# Patient Record
Sex: Male | Born: 1935 | Race: White | Hispanic: No | Marital: Married | Smoking: Former smoker
Health system: Southern US, Community
[De-identification: ages and names within clinical notes are randomized; demographics above are authoritative.]

## PROBLEM LIST (undated history)

## (undated) DIAGNOSIS — E119 Type 2 diabetes mellitus without complications: Secondary | ICD-10-CM

## (undated) DIAGNOSIS — K219 Gastro-esophageal reflux disease without esophagitis: Secondary | ICD-10-CM

## (undated) DIAGNOSIS — E781 Pure hyperglyceridemia: Secondary | ICD-10-CM

## (undated) DIAGNOSIS — M159 Polyosteoarthritis, unspecified: Secondary | ICD-10-CM

## (undated) DIAGNOSIS — K589 Irritable bowel syndrome without diarrhea: Secondary | ICD-10-CM

## (undated) HISTORY — DX: Irritable bowel syndrome, unspecified: K58.9

## (undated) HISTORY — DX: Polyosteoarthritis, unspecified: M15.9

## (undated) HISTORY — PX: OTHER SURGICAL HISTORY: SHX169

## (undated) HISTORY — DX: Gastro-esophageal reflux disease without esophagitis: K21.9

## (undated) HISTORY — DX: Pure hyperglyceridemia: E78.1

## (undated) HISTORY — DX: Type 2 diabetes mellitus without complications: E11.9

---

## 2004-05-04 ENCOUNTER — Ambulatory Visit (HOSPITAL_COMMUNITY): Admission: RE | Admit: 2004-05-04 | Discharge: 2004-05-04 | Payer: Self-pay | Admitting: Family Medicine

## 2004-09-01 ENCOUNTER — Ambulatory Visit (HOSPITAL_COMMUNITY): Admission: RE | Admit: 2004-09-01 | Discharge: 2004-09-01 | Payer: Self-pay | Admitting: Family Medicine

## 2005-08-11 ENCOUNTER — Ambulatory Visit (HOSPITAL_COMMUNITY): Admission: RE | Admit: 2005-08-11 | Discharge: 2005-08-11 | Payer: Self-pay | Admitting: Neurosurgery

## 2005-08-29 HISTORY — PX: BACK SURGERY: SHX140

## 2005-08-31 ENCOUNTER — Inpatient Hospital Stay (HOSPITAL_COMMUNITY): Admission: RE | Admit: 2005-08-31 | Discharge: 2005-09-02 | Payer: Self-pay | Admitting: Neurosurgery

## 2009-06-19 ENCOUNTER — Ambulatory Visit (HOSPITAL_COMMUNITY): Admission: RE | Admit: 2009-06-19 | Discharge: 2009-06-19 | Payer: Self-pay | Admitting: Internal Medicine

## 2009-06-24 ENCOUNTER — Ambulatory Visit (HOSPITAL_COMMUNITY): Admission: RE | Admit: 2009-06-24 | Discharge: 2009-06-24 | Payer: Self-pay | Admitting: Internal Medicine

## 2010-01-11 ENCOUNTER — Inpatient Hospital Stay (HOSPITAL_COMMUNITY): Admission: RE | Admit: 2010-01-11 | Discharge: 2010-01-12 | Payer: Self-pay | Admitting: Neurosurgery

## 2010-11-16 LAB — BASIC METABOLIC PANEL
CO2: 26 mEq/L (ref 19–32)
Calcium: 9.1 mg/dL (ref 8.4–10.5)
Chloride: 104 mEq/L (ref 96–112)
Creatinine, Ser: 0.96 mg/dL (ref 0.4–1.5)
GFR calc Af Amer: >60 mL/min (ref >60)
Glucose, Bld: 143 mg/dL — ABNORMAL HIGH (ref 70–99)

## 2010-11-16 LAB — SURGICAL PCR SCREEN
MRSA, PCR: NEGATIVE
Staphylococcus aureus: NEGATIVE

## 2010-11-16 LAB — CBC
Hemoglobin: 15.6 g/dL (ref 13.0–17.0)
MCHC: 35.1 g/dL (ref 30.0–36.0)
MCV: 92.4 fL (ref 78.0–100.0)
RBC: 4.8 MIL/uL (ref 4.22–5.81)
RDW: 13.3 % (ref 11.5–15.5)

## 2010-11-29 ENCOUNTER — Other Ambulatory Visit (HOSPITAL_COMMUNITY): Payer: Self-pay | Admitting: Family Medicine

## 2010-11-29 DIAGNOSIS — R1010 Upper abdominal pain, unspecified: Secondary | ICD-10-CM

## 2010-11-29 LAB — CBC AND DIFFERENTIAL
HCT: 48 % (ref 41–53)
Hemoglobin: 16.3 g/dL (ref 13.5–17.5)

## 2010-11-29 LAB — HEPATIC FUNCTION PANEL
ALT: 54 U/L — AB (ref 10–40)
Alkaline Phosphatase: 59 U/L (ref 25–125)

## 2010-11-29 LAB — HEMOGLOBIN A1C: Hgb A1c MFr Bld: 6.4 % — AB (ref 4.0–6.0)

## 2010-11-30 ENCOUNTER — Ambulatory Visit (HOSPITAL_COMMUNITY)
Admission: RE | Admit: 2010-11-30 | Discharge: 2010-11-30 | Disposition: A | Discharge status: Home / Self Care | Payer: Medicare Other | Source: Ambulatory Visit | Attending: Family Medicine | Admitting: Family Medicine

## 2010-11-30 DIAGNOSIS — K7689 Other specified diseases of liver: Secondary | ICD-10-CM | POA: Insufficient documentation

## 2010-11-30 DIAGNOSIS — R109 Unspecified abdominal pain: Secondary | ICD-10-CM | POA: Insufficient documentation

## 2010-11-30 DIAGNOSIS — R1010 Upper abdominal pain, unspecified: Secondary | ICD-10-CM

## 2010-12-13 ENCOUNTER — Ambulatory Visit (INDEPENDENT_AMBULATORY_CARE_PROVIDER_SITE_OTHER): Payer: Medicare Other | Admitting: Internal Medicine

## 2010-12-13 DIAGNOSIS — R142 Eructation: Secondary | ICD-10-CM

## 2010-12-14 ENCOUNTER — Other Ambulatory Visit (INDEPENDENT_AMBULATORY_CARE_PROVIDER_SITE_OTHER): Payer: Self-pay | Admitting: Internal Medicine

## 2010-12-14 DIAGNOSIS — R110 Nausea: Secondary | ICD-10-CM

## 2010-12-14 DIAGNOSIS — R140 Abdominal distension (gaseous): Secondary | ICD-10-CM

## 2010-12-14 DIAGNOSIS — R1013 Epigastric pain: Secondary | ICD-10-CM

## 2010-12-16 ENCOUNTER — Encounter (HOSPITAL_COMMUNITY): Payer: Self-pay

## 2010-12-16 ENCOUNTER — Encounter (HOSPITAL_COMMUNITY)
Admission: RE | Admit: 2010-12-16 | Discharge: 2010-12-16 | Disposition: A | Discharge status: Home / Self Care | Payer: Medicare Other | Source: Ambulatory Visit | Attending: Internal Medicine | Admitting: Internal Medicine

## 2010-12-16 DIAGNOSIS — R140 Abdominal distension (gaseous): Secondary | ICD-10-CM

## 2010-12-16 DIAGNOSIS — R110 Nausea: Secondary | ICD-10-CM | POA: Insufficient documentation

## 2010-12-16 DIAGNOSIS — R1013 Epigastric pain: Secondary | ICD-10-CM | POA: Insufficient documentation

## 2010-12-16 MED ORDER — TECHNETIUM TC 99M MEBROFENIN IV KIT
5.0000 | PACK | Freq: Once | INTRAVENOUS | Status: AC | PRN
  Administered 2010-12-16: 5.37 via INTRAVENOUS

## 2011-01-14 NOTE — Op Note (Signed)
Cody Johns, Cody Johns             ACCOUNT NO.:  [id number]   MEDICAL RECORD NO.:  [id number]          PATIENT TYPE:  INP   LOCATION:  5022                         FACILITY:  [facility]   PHYSICIAN:  Cristi Loron, M.D.DATE OF BIRTH:  [birth year]   DATE OF PROCEDURE:  08/31/2005  DATE OF DISCHARGE:  08/11/2005                                 OPERATIVE REPORT   BRIEF HISTORY:  The patient is a 75 year old white male who has suffered  from severe back and bilateral leg pain.  He has failed medical management  and was worked up with a lumbar MRI which demonstrated lumbar stenosis.  His  symptoms were consistent with neurogenic claudication.  I discussed the  various treatment options with the patient including doing nothing,  continuing medical management  and surgery, etc.  The patient has weighed  the risks, benefits and alternatives to surgery and decided to proceed with  a decompressive lumbar laminectomy.   PREOPERATIVE DIAGNOSIS:  L2-3, L3-4, L4-5 spinal stenosis, lumbar  radiculopathy and lumbago, disk degeneration.   POSTOPERATIVE DIAGNOSIS:  L2-3, L3-4, L4-5 spinal stenosis, lumbar  radiculopathy and lumbago, disk degeneration.   OPERATION PERFORMED:  L3 and L4 decompressive laminectomy, bilateral  foraminotomy and bilateral L2 laminotomy, foraminotomy.   SURGEON:  Cristi Loron, M.D.   ASSISTANT:  Clydene Fake, M.D.   ANESTHESIA:  General endotracheal.   ESTIMATED BLOOD LOSS:  150 mL.   SPECIMENS:  None.   DRAINS:  None.   COMPLICATIONS:  None.   DESCRIPTION OF PROCEDURE:  The patient was brought to the operating room by  the anesthesia team.  General endotracheal anesthesia was induced.  The  patient was then carefully turned to the prone position on the Wilson frame.  His lumbosacral region was then shaved and prepared with Betadine scrub and  Betadine solution and sterile drapes were applied.  I then injected the area  to be incised with Marcaine with  epinephrine solution.  I used a scalpel to  make a midline incision over the L2-3, 3-4 and 4-5 interspaces.  I used  electrocautery to perform subperiosteal dissection exposing the bilateral  spinous processes of the lamina of L2, 3, 4, and 5.  I obtained  intraoperative radiograph to confirm our location.  I then inserted the  [facility] retractor for exposure.  I then incised the interspinous ligament  at L2-3, 3-4 and 4-5 with the scalpel.  I used the Leksell rongeur to remove  the spinous process of L3 and L4.  I then used a high speed drill to perform  bilateral L4, L3 and L2 laminotomies.  I completed the laminectomy at L3 and  L4 using the Kerrison punch and widened the L2 laminotomies.  I removed the  ligamentum flavum of L2-3, 3-4 and 4-5 and then performed a foraminotomy  about the bilateral L3, 4, and 5 nerve roots.  There was considerable  lateral recess stenosis, worse at L3-4.  We removed the excess ligamentum  flavum from the lateral  recess and as above, performed generous  foraminotomies about the bilateral L3, 4, and 5 nerve roots completing  the  decompression.  I then palpated along the ventral surface of the thecal sac  and along the exit route of the nerve roots and noted that they were well  decompressed.  I used a nerve hook to palpate the intervertebral disk at L2-  3, 3-4 and 4-5 and there was some bulging but no herniations were noted.  I  then obtained hemostasis using bipolar electrocautery and irrigated the  wound out with bacitracin solution.  I then removed the McCullough retractor  and then reapproximated the patient's thoracolumbar fascia with interrupted  #1 Vicryl sutures, subcutaneous tissue with interrupted 2-0 Vicryl suture  and the skin with Steri-Strips and benzoin. The wound was then coated with  bacitracin ointment, sterile dressing was applied, the drapes were removed.  The patient was subsequently returned to supine position where he was   extubated by the anesthesia team and transported to the post anesthesia care  unit in stable condition.  All sponge, needle and instrument counts were  correct at the end of the case.      Cristi Loron, M.D.  Electronically Signed     JDJ/MEDQ  D:  08/31/2005  T:  09/01/2005  Job:  [id number]

## 2011-01-17 LAB — HEPATIC FUNCTION PANEL
ALT: 29 U/L (ref 10–40)
AST: 28 U/L (ref 14–40)
Bilirubin, Total: 1 mg/dL

## 2011-03-08 ENCOUNTER — Encounter (INDEPENDENT_AMBULATORY_CARE_PROVIDER_SITE_OTHER): Payer: Self-pay

## 2011-03-08 DIAGNOSIS — M159 Polyosteoarthritis, unspecified: Secondary | ICD-10-CM | POA: Insufficient documentation

## 2011-03-08 DIAGNOSIS — E781 Pure hyperglyceridemia: Secondary | ICD-10-CM | POA: Insufficient documentation

## 2011-03-08 DIAGNOSIS — K589 Irritable bowel syndrome without diarrhea: Secondary | ICD-10-CM | POA: Insufficient documentation

## 2011-03-21 ENCOUNTER — Ambulatory Visit (INDEPENDENT_AMBULATORY_CARE_PROVIDER_SITE_OTHER): Payer: Medicare Other | Admitting: Internal Medicine

## 2011-05-30 ENCOUNTER — Ambulatory Visit (INDEPENDENT_AMBULATORY_CARE_PROVIDER_SITE_OTHER): Payer: Medicare Other | Admitting: Internal Medicine

## 2012-05-01 ENCOUNTER — Other Ambulatory Visit (HOSPITAL_COMMUNITY): Payer: Self-pay | Admitting: Internal Medicine

## 2012-05-01 DIAGNOSIS — Z0000 Encounter for general adult medical examination without abnormal findings: Secondary | ICD-10-CM

## 2012-05-03 ENCOUNTER — Ambulatory Visit (HOSPITAL_COMMUNITY)
Admission: RE | Admit: 2012-05-03 | Discharge: 2012-05-03 | Disposition: A | Discharge status: Home / Self Care | Payer: Medicare Other | Source: Ambulatory Visit | Attending: Internal Medicine | Admitting: Internal Medicine

## 2012-05-03 DIAGNOSIS — I6529 Occlusion and stenosis of unspecified carotid artery: Secondary | ICD-10-CM | POA: Insufficient documentation

## 2012-05-03 DIAGNOSIS — Z0000 Encounter for general adult medical examination without abnormal findings: Secondary | ICD-10-CM

## 2012-05-03 DIAGNOSIS — R0989 Other specified symptoms and signs involving the circulatory and respiratory systems: Secondary | ICD-10-CM | POA: Insufficient documentation

## 2012-05-03 DIAGNOSIS — F17200 Nicotine dependence, unspecified, uncomplicated: Secondary | ICD-10-CM | POA: Insufficient documentation

## 2013-07-30 ENCOUNTER — Other Ambulatory Visit (HOSPITAL_COMMUNITY): Payer: Self-pay | Admitting: Internal Medicine

## 2013-08-05 ENCOUNTER — Ambulatory Visit (HOSPITAL_COMMUNITY)
Admission: RE | Admit: 2013-08-05 | Discharge: 2013-08-05 | Disposition: A | Discharge status: Home / Self Care | Payer: Medicare Other | Source: Ambulatory Visit | Attending: Internal Medicine | Admitting: Internal Medicine

## 2013-08-05 DIAGNOSIS — I6529 Occlusion and stenosis of unspecified carotid artery: Secondary | ICD-10-CM | POA: Insufficient documentation

## 2015-11-18 DIAGNOSIS — K4090 Unilateral inguinal hernia, without obstruction or gangrene, not specified as recurrent: Secondary | ICD-10-CM | POA: Diagnosis not present

## 2015-11-18 DIAGNOSIS — Z0000 Encounter for general adult medical examination without abnormal findings: Secondary | ICD-10-CM | POA: Diagnosis not present

## 2015-11-18 DIAGNOSIS — R972 Elevated prostate specific antigen [PSA]: Secondary | ICD-10-CM | POA: Diagnosis not present

## 2015-11-26 DIAGNOSIS — E782 Mixed hyperlipidemia: Secondary | ICD-10-CM | POA: Diagnosis not present

## 2015-11-26 DIAGNOSIS — Z1389 Encounter for screening for other disorder: Secondary | ICD-10-CM | POA: Diagnosis not present

## 2015-11-26 DIAGNOSIS — Z0000 Encounter for general adult medical examination without abnormal findings: Secondary | ICD-10-CM | POA: Diagnosis not present

## 2015-11-26 DIAGNOSIS — Z6828 Body mass index (BMI) 28.0-28.9, adult: Secondary | ICD-10-CM | POA: Diagnosis not present

## 2015-11-26 DIAGNOSIS — R972 Elevated prostate specific antigen [PSA]: Secondary | ICD-10-CM | POA: Diagnosis not present

## 2015-11-26 DIAGNOSIS — R7309 Other abnormal glucose: Secondary | ICD-10-CM | POA: Diagnosis not present

## 2015-12-18 DIAGNOSIS — M25512 Pain in left shoulder: Secondary | ICD-10-CM | POA: Diagnosis not present

## 2015-12-18 DIAGNOSIS — M7542 Impingement syndrome of left shoulder: Secondary | ICD-10-CM | POA: Diagnosis not present

## 2016-02-18 DIAGNOSIS — Z6827 Body mass index (BMI) 27.0-27.9, adult: Secondary | ICD-10-CM | POA: Diagnosis not present

## 2016-02-18 DIAGNOSIS — E663 Overweight: Secondary | ICD-10-CM | POA: Diagnosis not present

## 2016-02-18 DIAGNOSIS — E119 Type 2 diabetes mellitus without complications: Secondary | ICD-10-CM | POA: Diagnosis not present

## 2016-02-18 DIAGNOSIS — Z1389 Encounter for screening for other disorder: Secondary | ICD-10-CM | POA: Diagnosis not present

## 2016-03-28 DIAGNOSIS — D485 Neoplasm of uncertain behavior of skin: Secondary | ICD-10-CM | POA: Diagnosis not present

## 2016-03-28 DIAGNOSIS — L98499 Non-pressure chronic ulcer of skin of other sites with unspecified severity: Secondary | ICD-10-CM | POA: Diagnosis not present

## 2016-03-28 DIAGNOSIS — L570 Actinic keratosis: Secondary | ICD-10-CM | POA: Diagnosis not present

## 2016-06-01 DIAGNOSIS — H2513 Age-related nuclear cataract, bilateral: Secondary | ICD-10-CM | POA: Diagnosis not present

## 2016-06-01 DIAGNOSIS — H02834 Dermatochalasis of left upper eyelid: Secondary | ICD-10-CM | POA: Diagnosis not present

## 2016-06-01 DIAGNOSIS — H02831 Dermatochalasis of right upper eyelid: Secondary | ICD-10-CM | POA: Diagnosis not present

## 2016-10-03 DIAGNOSIS — L98499 Non-pressure chronic ulcer of skin of other sites with unspecified severity: Secondary | ICD-10-CM | POA: Diagnosis not present

## 2016-10-03 DIAGNOSIS — D492 Neoplasm of unspecified behavior of bone, soft tissue, and skin: Secondary | ICD-10-CM | POA: Diagnosis not present

## 2016-10-03 DIAGNOSIS — L570 Actinic keratosis: Secondary | ICD-10-CM | POA: Diagnosis not present

## 2017-01-26 DIAGNOSIS — Z1389 Encounter for screening for other disorder: Secondary | ICD-10-CM | POA: Diagnosis not present

## 2017-01-26 DIAGNOSIS — Z6827 Body mass index (BMI) 27.0-27.9, adult: Secondary | ICD-10-CM | POA: Diagnosis not present

## 2017-01-26 DIAGNOSIS — E782 Mixed hyperlipidemia: Secondary | ICD-10-CM | POA: Diagnosis not present

## 2017-01-26 DIAGNOSIS — M1991 Primary osteoarthritis, unspecified site: Secondary | ICD-10-CM | POA: Diagnosis not present

## 2017-01-26 DIAGNOSIS — E119 Type 2 diabetes mellitus without complications: Secondary | ICD-10-CM | POA: Diagnosis not present

## 2017-01-26 DIAGNOSIS — M2041 Other hammer toe(s) (acquired), right foot: Secondary | ICD-10-CM | POA: Diagnosis not present

## 2017-02-27 ENCOUNTER — Ambulatory Visit (INDEPENDENT_AMBULATORY_CARE_PROVIDER_SITE_OTHER): Payer: PPO | Admitting: Otolaryngology

## 2017-02-27 DIAGNOSIS — H9012 Conductive hearing loss, unilateral, left ear, with unrestricted hearing on the contralateral side: Secondary | ICD-10-CM

## 2017-02-27 DIAGNOSIS — H6122 Impacted cerumen, left ear: Secondary | ICD-10-CM | POA: Diagnosis not present

## 2017-03-13 ENCOUNTER — Ambulatory Visit (INDEPENDENT_AMBULATORY_CARE_PROVIDER_SITE_OTHER): Payer: PPO | Admitting: Otolaryngology

## 2017-03-13 DIAGNOSIS — H6122 Impacted cerumen, left ear: Secondary | ICD-10-CM | POA: Diagnosis not present

## 2017-06-16 DIAGNOSIS — R972 Elevated prostate specific antigen [PSA]: Secondary | ICD-10-CM | POA: Diagnosis not present

## 2017-06-16 DIAGNOSIS — M1991 Primary osteoarthritis, unspecified site: Secondary | ICD-10-CM | POA: Diagnosis not present

## 2017-06-16 DIAGNOSIS — K589 Irritable bowel syndrome without diarrhea: Secondary | ICD-10-CM | POA: Diagnosis not present

## 2017-06-16 DIAGNOSIS — E663 Overweight: Secondary | ICD-10-CM | POA: Diagnosis not present

## 2017-06-16 DIAGNOSIS — Z6826 Body mass index (BMI) 26.0-26.9, adult: Secondary | ICD-10-CM | POA: Diagnosis not present

## 2017-06-16 DIAGNOSIS — E782 Mixed hyperlipidemia: Secondary | ICD-10-CM | POA: Diagnosis not present

## 2017-06-16 DIAGNOSIS — E119 Type 2 diabetes mellitus without complications: Secondary | ICD-10-CM | POA: Diagnosis not present

## 2017-07-14 DIAGNOSIS — R972 Elevated prostate specific antigen [PSA]: Secondary | ICD-10-CM | POA: Diagnosis not present

## 2017-07-14 DIAGNOSIS — N401 Enlarged prostate with lower urinary tract symptoms: Secondary | ICD-10-CM | POA: Diagnosis not present

## 2017-07-14 DIAGNOSIS — R3915 Urgency of urination: Secondary | ICD-10-CM | POA: Diagnosis not present

## 2017-08-02 DIAGNOSIS — H2513 Age-related nuclear cataract, bilateral: Secondary | ICD-10-CM | POA: Diagnosis not present

## 2017-08-02 DIAGNOSIS — H02831 Dermatochalasis of right upper eyelid: Secondary | ICD-10-CM | POA: Diagnosis not present

## 2017-08-02 DIAGNOSIS — H02834 Dermatochalasis of left upper eyelid: Secondary | ICD-10-CM | POA: Diagnosis not present

## 2017-08-02 DIAGNOSIS — H04123 Dry eye syndrome of bilateral lacrimal glands: Secondary | ICD-10-CM | POA: Diagnosis not present

## 2017-10-05 DIAGNOSIS — K589 Irritable bowel syndrome without diarrhea: Secondary | ICD-10-CM | POA: Diagnosis not present

## 2017-10-05 DIAGNOSIS — D696 Thrombocytopenia, unspecified: Secondary | ICD-10-CM | POA: Diagnosis not present

## 2017-10-05 DIAGNOSIS — Z0001 Encounter for general adult medical examination with abnormal findings: Secondary | ICD-10-CM | POA: Diagnosis not present

## 2017-10-05 DIAGNOSIS — E782 Mixed hyperlipidemia: Secondary | ICD-10-CM | POA: Diagnosis not present

## 2017-10-05 DIAGNOSIS — Z1389 Encounter for screening for other disorder: Secondary | ICD-10-CM | POA: Diagnosis not present

## 2017-10-05 DIAGNOSIS — E663 Overweight: Secondary | ICD-10-CM | POA: Diagnosis not present

## 2017-10-05 DIAGNOSIS — Z6827 Body mass index (BMI) 27.0-27.9, adult: Secondary | ICD-10-CM | POA: Diagnosis not present

## 2017-10-05 DIAGNOSIS — E119 Type 2 diabetes mellitus without complications: Secondary | ICD-10-CM | POA: Diagnosis not present

## 2017-10-05 DIAGNOSIS — M1991 Primary osteoarthritis, unspecified site: Secondary | ICD-10-CM | POA: Diagnosis not present

## 2017-10-05 DIAGNOSIS — R972 Elevated prostate specific antigen [PSA]: Secondary | ICD-10-CM | POA: Diagnosis not present

## 2018-02-08 DIAGNOSIS — E663 Overweight: Secondary | ICD-10-CM | POA: Diagnosis not present

## 2018-02-08 DIAGNOSIS — E1165 Type 2 diabetes mellitus with hyperglycemia: Secondary | ICD-10-CM | POA: Diagnosis not present

## 2018-02-08 DIAGNOSIS — E782 Mixed hyperlipidemia: Secondary | ICD-10-CM | POA: Diagnosis not present

## 2018-02-08 DIAGNOSIS — G43909 Migraine, unspecified, not intractable, without status migrainosus: Secondary | ICD-10-CM | POA: Diagnosis not present

## 2018-02-08 DIAGNOSIS — T464X5A Adverse effect of angiotensin-converting-enzyme inhibitors, initial encounter: Secondary | ICD-10-CM | POA: Diagnosis not present

## 2018-02-08 DIAGNOSIS — H532 Diplopia: Secondary | ICD-10-CM | POA: Diagnosis not present

## 2018-02-08 DIAGNOSIS — Z6827 Body mass index (BMI) 27.0-27.9, adult: Secondary | ICD-10-CM | POA: Diagnosis not present

## 2018-02-12 ENCOUNTER — Other Ambulatory Visit (HOSPITAL_COMMUNITY): Payer: Self-pay | Admitting: Family Medicine

## 2018-02-12 DIAGNOSIS — I708 Atherosclerosis of other arteries: Secondary | ICD-10-CM

## 2018-02-12 DIAGNOSIS — I7090 Unspecified atherosclerosis: Secondary | ICD-10-CM

## 2018-02-16 ENCOUNTER — Ambulatory Visit (HOSPITAL_COMMUNITY)
Admission: RE | Admit: 2018-02-16 | Discharge: 2018-02-16 | Disposition: A | Discharge status: Home / Self Care | Payer: PPO | Source: Ambulatory Visit | Attending: Family Medicine | Admitting: Family Medicine

## 2018-02-16 DIAGNOSIS — I6523 Occlusion and stenosis of bilateral carotid arteries: Secondary | ICD-10-CM | POA: Diagnosis not present

## 2018-02-16 DIAGNOSIS — I7090 Unspecified atherosclerosis: Secondary | ICD-10-CM

## 2018-02-16 DIAGNOSIS — I708 Atherosclerosis of other arteries: Secondary | ICD-10-CM | POA: Diagnosis not present

## 2018-02-16 DIAGNOSIS — I6529 Occlusion and stenosis of unspecified carotid artery: Secondary | ICD-10-CM | POA: Diagnosis not present

## 2018-03-14 DIAGNOSIS — H903 Sensorineural hearing loss, bilateral: Secondary | ICD-10-CM | POA: Diagnosis not present

## 2018-03-29 DIAGNOSIS — K4090 Unilateral inguinal hernia, without obstruction or gangrene, not specified as recurrent: Secondary | ICD-10-CM | POA: Diagnosis not present

## 2018-03-29 DIAGNOSIS — R7309 Other abnormal glucose: Secondary | ICD-10-CM | POA: Diagnosis not present

## 2018-03-29 DIAGNOSIS — E663 Overweight: Secondary | ICD-10-CM | POA: Diagnosis not present

## 2018-03-29 DIAGNOSIS — E119 Type 2 diabetes mellitus without complications: Secondary | ICD-10-CM | POA: Diagnosis not present

## 2018-03-29 DIAGNOSIS — Z6827 Body mass index (BMI) 27.0-27.9, adult: Secondary | ICD-10-CM | POA: Diagnosis not present

## 2018-03-29 DIAGNOSIS — S39012A Strain of muscle, fascia and tendon of lower back, initial encounter: Secondary | ICD-10-CM | POA: Diagnosis not present

## 2018-04-26 DIAGNOSIS — E663 Overweight: Secondary | ICD-10-CM | POA: Diagnosis not present

## 2018-04-26 DIAGNOSIS — E1165 Type 2 diabetes mellitus with hyperglycemia: Secondary | ICD-10-CM | POA: Diagnosis not present

## 2018-04-26 DIAGNOSIS — Z6827 Body mass index (BMI) 27.0-27.9, adult: Secondary | ICD-10-CM | POA: Diagnosis not present

## 2018-05-26 DIAGNOSIS — S72141A Displaced intertrochanteric fracture of right femur, initial encounter for closed fracture: Secondary | ICD-10-CM | POA: Diagnosis not present

## 2018-05-28 DIAGNOSIS — S72141A Displaced intertrochanteric fracture of right femur, initial encounter for closed fracture: Secondary | ICD-10-CM | POA: Diagnosis not present

## 2018-05-28 DIAGNOSIS — I10 Essential (primary) hypertension: Secondary | ICD-10-CM | POA: Diagnosis not present

## 2018-05-28 DIAGNOSIS — M6281 Muscle weakness (generalized): Secondary | ICD-10-CM | POA: Diagnosis not present

## 2018-05-28 DIAGNOSIS — Z9181 History of falling: Secondary | ICD-10-CM | POA: Diagnosis not present

## 2018-05-28 DIAGNOSIS — Z7984 Long term (current) use of oral hypoglycemic drugs: Secondary | ICD-10-CM | POA: Diagnosis not present

## 2018-05-28 DIAGNOSIS — Z96641 Presence of right artificial hip joint: Secondary | ICD-10-CM | POA: Diagnosis not present

## 2018-05-28 DIAGNOSIS — R110 Nausea: Secondary | ICD-10-CM | POA: Diagnosis not present

## 2018-05-28 DIAGNOSIS — S72001A Fracture of unspecified part of neck of right femur, initial encounter for closed fracture: Secondary | ICD-10-CM | POA: Diagnosis not present

## 2018-05-28 DIAGNOSIS — M8588 Other specified disorders of bone density and structure, other site: Secondary | ICD-10-CM | POA: Diagnosis not present

## 2018-05-28 DIAGNOSIS — E119 Type 2 diabetes mellitus without complications: Secondary | ICD-10-CM | POA: Diagnosis not present

## 2018-05-28 DIAGNOSIS — M84359A Stress fracture, hip, unspecified, initial encounter for fracture: Secondary | ICD-10-CM | POA: Diagnosis not present

## 2018-05-28 DIAGNOSIS — M25551 Pain in right hip: Secondary | ICD-10-CM | POA: Diagnosis not present

## 2018-05-28 DIAGNOSIS — S72001D Fracture of unspecified part of neck of right femur, subsequent encounter for closed fracture with routine healing: Secondary | ICD-10-CM | POA: Diagnosis not present

## 2018-05-28 DIAGNOSIS — S72041A Displaced fracture of base of neck of right femur, initial encounter for closed fracture: Secondary | ICD-10-CM | POA: Diagnosis not present

## 2018-05-28 DIAGNOSIS — R52 Pain, unspecified: Secondary | ICD-10-CM | POA: Diagnosis not present

## 2018-05-28 DIAGNOSIS — S79911A Unspecified injury of right hip, initial encounter: Secondary | ICD-10-CM | POA: Diagnosis not present

## 2018-05-28 DIAGNOSIS — E7849 Other hyperlipidemia: Secondary | ICD-10-CM | POA: Diagnosis not present

## 2018-05-28 DIAGNOSIS — Z471 Aftercare following joint replacement surgery: Secondary | ICD-10-CM | POA: Diagnosis not present

## 2018-05-28 DIAGNOSIS — R2689 Other abnormalities of gait and mobility: Secondary | ICD-10-CM | POA: Diagnosis not present

## 2018-05-28 DIAGNOSIS — W19XXXA Unspecified fall, initial encounter: Secondary | ICD-10-CM | POA: Diagnosis not present

## 2018-05-28 DIAGNOSIS — W010XXA Fall on same level from slipping, tripping and stumbling without subsequent striking against object, initial encounter: Secondary | ICD-10-CM | POA: Diagnosis not present

## 2018-05-28 DIAGNOSIS — Z4889 Encounter for other specified surgical aftercare: Secondary | ICD-10-CM | POA: Diagnosis not present

## 2018-05-28 DIAGNOSIS — R0902 Hypoxemia: Secondary | ICD-10-CM | POA: Diagnosis not present

## 2018-05-28 DIAGNOSIS — R609 Edema, unspecified: Secondary | ICD-10-CM | POA: Diagnosis not present

## 2018-05-31 DIAGNOSIS — S72041A Displaced fracture of base of neck of right femur, initial encounter for closed fracture: Secondary | ICD-10-CM | POA: Diagnosis not present

## 2018-06-04 DIAGNOSIS — S72141A Displaced intertrochanteric fracture of right femur, initial encounter for closed fracture: Secondary | ICD-10-CM | POA: Diagnosis not present

## 2018-06-04 DIAGNOSIS — I10 Essential (primary) hypertension: Secondary | ICD-10-CM | POA: Diagnosis not present

## 2018-06-04 DIAGNOSIS — M84359A Stress fracture, hip, unspecified, initial encounter for fracture: Secondary | ICD-10-CM | POA: Diagnosis not present

## 2018-06-04 DIAGNOSIS — E119 Type 2 diabetes mellitus without complications: Secondary | ICD-10-CM | POA: Diagnosis not present

## 2018-06-04 DIAGNOSIS — W19XXXA Unspecified fall, initial encounter: Secondary | ICD-10-CM | POA: Diagnosis not present

## 2018-06-04 DIAGNOSIS — Z9181 History of falling: Secondary | ICD-10-CM | POA: Diagnosis not present

## 2018-06-04 DIAGNOSIS — R2689 Other abnormalities of gait and mobility: Secondary | ICD-10-CM | POA: Diagnosis not present

## 2018-06-04 DIAGNOSIS — M1611 Unilateral primary osteoarthritis, right hip: Secondary | ICD-10-CM | POA: Diagnosis not present

## 2018-06-04 DIAGNOSIS — E7849 Other hyperlipidemia: Secondary | ICD-10-CM | POA: Diagnosis not present

## 2018-06-04 DIAGNOSIS — Z7984 Long term (current) use of oral hypoglycemic drugs: Secondary | ICD-10-CM | POA: Diagnosis not present

## 2018-06-04 DIAGNOSIS — S72001D Fracture of unspecified part of neck of right femur, subsequent encounter for closed fracture with routine healing: Secondary | ICD-10-CM | POA: Diagnosis not present

## 2018-06-04 DIAGNOSIS — Z96641 Presence of right artificial hip joint: Secondary | ICD-10-CM | POA: Diagnosis not present

## 2018-06-04 DIAGNOSIS — M6281 Muscle weakness (generalized): Secondary | ICD-10-CM | POA: Diagnosis not present

## 2018-06-06 DIAGNOSIS — M1611 Unilateral primary osteoarthritis, right hip: Secondary | ICD-10-CM | POA: Diagnosis not present

## 2018-06-09 DIAGNOSIS — S72141D Displaced intertrochanteric fracture of right femur, subsequent encounter for closed fracture with routine healing: Secondary | ICD-10-CM | POA: Diagnosis not present

## 2018-06-09 DIAGNOSIS — M1990 Unspecified osteoarthritis, unspecified site: Secondary | ICD-10-CM | POA: Diagnosis not present

## 2018-06-09 DIAGNOSIS — E119 Type 2 diabetes mellitus without complications: Secondary | ICD-10-CM | POA: Diagnosis not present

## 2018-06-09 DIAGNOSIS — Z9181 History of falling: Secondary | ICD-10-CM | POA: Diagnosis not present

## 2018-06-09 DIAGNOSIS — E785 Hyperlipidemia, unspecified: Secondary | ICD-10-CM | POA: Diagnosis not present

## 2018-06-09 DIAGNOSIS — Z7982 Long term (current) use of aspirin: Secondary | ICD-10-CM | POA: Diagnosis not present

## 2018-06-09 DIAGNOSIS — I10 Essential (primary) hypertension: Secondary | ICD-10-CM | POA: Diagnosis not present

## 2018-06-09 DIAGNOSIS — Z7984 Long term (current) use of oral hypoglycemic drugs: Secondary | ICD-10-CM | POA: Diagnosis not present

## 2018-06-11 ENCOUNTER — Other Ambulatory Visit: Payer: Self-pay

## 2018-06-11 NOTE — Patient Outreach (Signed)
[facility]) Care Management  06/11/2018  Cody Johns July 31, 1936 [id number]     EMMI-General Discharge RED ON EMMI ALERT Day # 1 Date: 06/09/18 Red Alert Reason: " Got discharge papers? I don't know  Know who to call about changes in condition? No"   Outreach attempt # 1 to patient. Spoke with patient who voices he is doing fairly well since return home. He denies any acute needs or concerns at this time. Reviewed and addressed red alert with patient. Patient unsure of responses that were recorded. He confirmed that he has discharge paperwork and understands them. He is aware of when, why and how to contact MD for any issues or concerns. Patient confirmed that he has MD follow up appt on this Thursday. He denies any issues with transportation. Patient able to confirm that he has all his meds and no issues or concerns regarding them. He voices that he is getting [facility] services and they have been out already to see and evaluate him. He denies any further RN CM needs or concerns at this time. Advised patient that they would get one more automated EMMI-GENERAL post discharge calls to assess how they are doing following recent hospitalization and will receive a call from a nurse if any of their responses were abnormal. Patient voiced understanding and was appreciative of f/u call.        Plan: RN CM will close case at this time.    Enzo Montgomery, RN,BSN,CCM [facility] Telephonic Care Management Coordinator Direct Phone: [phone] Toll Free: [phone] Fax: [phone]

## 2018-06-18 DIAGNOSIS — N400 Enlarged prostate without lower urinary tract symptoms: Secondary | ICD-10-CM | POA: Diagnosis not present

## 2018-06-18 DIAGNOSIS — Z6825 Body mass index (BMI) 25.0-25.9, adult: Secondary | ICD-10-CM | POA: Diagnosis not present

## 2018-06-18 DIAGNOSIS — I872 Venous insufficiency (chronic) (peripheral): Secondary | ICD-10-CM | POA: Diagnosis not present

## 2018-06-18 DIAGNOSIS — R5383 Other fatigue: Secondary | ICD-10-CM | POA: Diagnosis not present

## 2018-06-18 DIAGNOSIS — D649 Anemia, unspecified: Secondary | ICD-10-CM | POA: Diagnosis not present

## 2018-06-18 DIAGNOSIS — S72001A Fracture of unspecified part of neck of right femur, initial encounter for closed fracture: Secondary | ICD-10-CM | POA: Diagnosis not present

## 2018-06-18 DIAGNOSIS — R531 Weakness: Secondary | ICD-10-CM | POA: Diagnosis not present

## 2018-06-19 DIAGNOSIS — Z7984 Long term (current) use of oral hypoglycemic drugs: Secondary | ICD-10-CM | POA: Diagnosis not present

## 2018-06-19 DIAGNOSIS — Z9181 History of falling: Secondary | ICD-10-CM | POA: Diagnosis not present

## 2018-06-19 DIAGNOSIS — M1990 Unspecified osteoarthritis, unspecified site: Secondary | ICD-10-CM | POA: Diagnosis not present

## 2018-06-19 DIAGNOSIS — E119 Type 2 diabetes mellitus without complications: Secondary | ICD-10-CM | POA: Diagnosis not present

## 2018-06-19 DIAGNOSIS — I10 Essential (primary) hypertension: Secondary | ICD-10-CM | POA: Diagnosis not present

## 2018-06-19 DIAGNOSIS — S72141D Displaced intertrochanteric fracture of right femur, subsequent encounter for closed fracture with routine healing: Secondary | ICD-10-CM | POA: Diagnosis not present

## 2018-06-19 DIAGNOSIS — Z7982 Long term (current) use of aspirin: Secondary | ICD-10-CM | POA: Diagnosis not present

## 2018-06-19 DIAGNOSIS — E785 Hyperlipidemia, unspecified: Secondary | ICD-10-CM | POA: Diagnosis not present

## 2018-07-10 DIAGNOSIS — Z96641 Presence of right artificial hip joint: Secondary | ICD-10-CM | POA: Diagnosis not present

## 2018-07-10 DIAGNOSIS — Z79899 Other long term (current) drug therapy: Secondary | ICD-10-CM | POA: Diagnosis not present

## 2018-07-10 DIAGNOSIS — Z7984 Long term (current) use of oral hypoglycemic drugs: Secondary | ICD-10-CM | POA: Diagnosis not present

## 2018-07-10 DIAGNOSIS — R531 Weakness: Secondary | ICD-10-CM | POA: Diagnosis not present

## 2018-07-10 DIAGNOSIS — R52 Pain, unspecified: Secondary | ICD-10-CM | POA: Diagnosis not present

## 2018-07-10 DIAGNOSIS — M25551 Pain in right hip: Secondary | ICD-10-CM | POA: Diagnosis not present

## 2018-07-10 DIAGNOSIS — Z7982 Long term (current) use of aspirin: Secondary | ICD-10-CM | POA: Diagnosis not present

## 2018-07-11 DIAGNOSIS — Z96641 Presence of right artificial hip joint: Secondary | ICD-10-CM | POA: Diagnosis not present

## 2018-07-16 DIAGNOSIS — N401 Enlarged prostate with lower urinary tract symptoms: Secondary | ICD-10-CM | POA: Diagnosis not present

## 2018-07-16 DIAGNOSIS — R972 Elevated prostate specific antigen [PSA]: Secondary | ICD-10-CM | POA: Diagnosis not present

## 2018-07-16 DIAGNOSIS — R3915 Urgency of urination: Secondary | ICD-10-CM | POA: Diagnosis not present

## 2018-08-06 DIAGNOSIS — H04123 Dry eye syndrome of bilateral lacrimal glands: Secondary | ICD-10-CM | POA: Diagnosis not present

## 2018-08-06 DIAGNOSIS — H2513 Age-related nuclear cataract, bilateral: Secondary | ICD-10-CM | POA: Diagnosis not present

## 2018-08-06 DIAGNOSIS — E119 Type 2 diabetes mellitus without complications: Secondary | ICD-10-CM | POA: Diagnosis not present

## 2018-08-16 DIAGNOSIS — M1612 Unilateral primary osteoarthritis, left hip: Secondary | ICD-10-CM | POA: Diagnosis not present

## 2018-08-16 DIAGNOSIS — M1611 Unilateral primary osteoarthritis, right hip: Secondary | ICD-10-CM | POA: Diagnosis not present

## 2018-09-13 ENCOUNTER — Ambulatory Visit (HOSPITAL_COMMUNITY)
Admission: RE | Admit: 2018-09-13 | Discharge: 2018-09-13 | Disposition: A | Discharge status: Home / Self Care | Payer: PPO | Source: Ambulatory Visit | Attending: Internal Medicine | Admitting: Internal Medicine

## 2018-09-13 ENCOUNTER — Other Ambulatory Visit: Payer: Self-pay | Admitting: Internal Medicine

## 2018-09-13 DIAGNOSIS — M5431 Sciatica, right side: Secondary | ICD-10-CM | POA: Diagnosis not present

## 2018-09-13 DIAGNOSIS — I872 Venous insufficiency (chronic) (peripheral): Secondary | ICD-10-CM | POA: Diagnosis not present

## 2018-09-13 DIAGNOSIS — Z0001 Encounter for general adult medical examination with abnormal findings: Secondary | ICD-10-CM | POA: Diagnosis not present

## 2018-09-13 DIAGNOSIS — D696 Thrombocytopenia, unspecified: Secondary | ICD-10-CM | POA: Diagnosis not present

## 2018-09-13 DIAGNOSIS — Z1389 Encounter for screening for other disorder: Secondary | ICD-10-CM | POA: Diagnosis not present

## 2018-09-13 DIAGNOSIS — M79604 Pain in right leg: Secondary | ICD-10-CM | POA: Diagnosis not present

## 2018-09-13 DIAGNOSIS — E119 Type 2 diabetes mellitus without complications: Secondary | ICD-10-CM | POA: Diagnosis not present

## 2018-09-13 DIAGNOSIS — E1165 Type 2 diabetes mellitus with hyperglycemia: Secondary | ICD-10-CM | POA: Diagnosis not present

## 2018-09-13 DIAGNOSIS — G43909 Migraine, unspecified, not intractable, without status migrainosus: Secondary | ICD-10-CM | POA: Diagnosis not present

## 2018-09-13 DIAGNOSIS — I8393 Asymptomatic varicose veins of bilateral lower extremities: Secondary | ICD-10-CM | POA: Diagnosis not present

## 2018-09-13 DIAGNOSIS — I8001 Phlebitis and thrombophlebitis of superficial vessels of right lower extremity: Secondary | ICD-10-CM | POA: Diagnosis not present

## 2018-09-13 DIAGNOSIS — Z681 Body mass index (BMI) 19 or less, adult: Secondary | ICD-10-CM | POA: Diagnosis not present

## 2018-09-13 DIAGNOSIS — R600 Localized edema: Secondary | ICD-10-CM | POA: Diagnosis not present

## 2018-09-25 DIAGNOSIS — R202 Paresthesia of skin: Secondary | ICD-10-CM | POA: Diagnosis not present

## 2018-09-25 DIAGNOSIS — Z6824 Body mass index (BMI) 24.0-24.9, adult: Secondary | ICD-10-CM | POA: Diagnosis not present

## 2018-09-25 DIAGNOSIS — I872 Venous insufficiency (chronic) (peripheral): Secondary | ICD-10-CM | POA: Diagnosis not present

## 2018-09-25 DIAGNOSIS — B351 Tinea unguium: Secondary | ICD-10-CM | POA: Diagnosis not present

## 2019-01-17 DIAGNOSIS — N400 Enlarged prostate without lower urinary tract symptoms: Secondary | ICD-10-CM | POA: Diagnosis not present

## 2019-01-17 DIAGNOSIS — Z1389 Encounter for screening for other disorder: Secondary | ICD-10-CM | POA: Diagnosis not present

## 2019-01-17 DIAGNOSIS — R201 Hypoesthesia of skin: Secondary | ICD-10-CM | POA: Diagnosis not present

## 2019-01-17 DIAGNOSIS — K589 Irritable bowel syndrome without diarrhea: Secondary | ICD-10-CM | POA: Diagnosis not present

## 2019-01-17 DIAGNOSIS — R1012 Left upper quadrant pain: Secondary | ICD-10-CM | POA: Diagnosis not present

## 2019-01-17 DIAGNOSIS — K219 Gastro-esophageal reflux disease without esophagitis: Secondary | ICD-10-CM | POA: Diagnosis not present

## 2019-01-17 DIAGNOSIS — E1140 Type 2 diabetes mellitus with diabetic neuropathy, unspecified: Secondary | ICD-10-CM | POA: Diagnosis not present

## 2019-01-17 DIAGNOSIS — M1991 Primary osteoarthritis, unspecified site: Secondary | ICD-10-CM | POA: Diagnosis not present

## 2019-01-17 DIAGNOSIS — Z6824 Body mass index (BMI) 24.0-24.9, adult: Secondary | ICD-10-CM | POA: Diagnosis not present

## 2019-01-23 ENCOUNTER — Other Ambulatory Visit: Payer: Self-pay | Admitting: Internal Medicine

## 2019-01-23 ENCOUNTER — Other Ambulatory Visit (HOSPITAL_COMMUNITY): Payer: Self-pay | Admitting: Internal Medicine

## 2019-01-23 DIAGNOSIS — R109 Unspecified abdominal pain: Secondary | ICD-10-CM

## 2019-01-24 ENCOUNTER — Other Ambulatory Visit: Payer: Self-pay

## 2019-01-24 ENCOUNTER — Ambulatory Visit (INDEPENDENT_AMBULATORY_CARE_PROVIDER_SITE_OTHER): Payer: PPO | Admitting: Internal Medicine

## 2019-01-24 ENCOUNTER — Encounter (INDEPENDENT_AMBULATORY_CARE_PROVIDER_SITE_OTHER): Payer: Self-pay | Admitting: Internal Medicine

## 2019-01-24 VITALS — BP 118/66 | HR 83 | Temp 98.4°F | Ht 72.0 in | Wt 184.0 lb

## 2019-01-24 DIAGNOSIS — R1012 Left upper quadrant pain: Secondary | ICD-10-CM | POA: Diagnosis not present

## 2019-01-24 DIAGNOSIS — Z1211 Encounter for screening for malignant neoplasm of colon: Secondary | ICD-10-CM | POA: Diagnosis not present

## 2019-01-24 LAB — CBC WITH DIFFERENTIAL/PLATELET
Absolute Monocytes: 468 cells/uL (ref 200–950)
Basophils Absolute: 42 cells/uL (ref 0–200)
Basophils Relative: 0.7 %
Eosinophils Absolute: 108 cells/uL (ref 15–500)
Eosinophils Relative: 1.8 %
HCT: 42.7 % (ref 38.5–50.0)
Hemoglobin: 14.3 g/dL (ref 13.2–17.1)
Lymphs Abs: 996 cells/uL (ref 850–3900)
MCH: 29.5 pg (ref 27.0–33.0)
MCHC: 33.5 g/dL (ref 32.0–36.0)
MCV: 88.2 fL (ref 80.0–100.0)
MPV: 10.4 fL (ref 7.5–12.5)
Monocytes Relative: 7.8 %
Neutro Abs: 4386 cells/uL (ref 1500–7800)
Neutrophils Relative %: 73.1 %
Platelets: 194 10*3/uL (ref 140–400)
RBC: 4.84 10*6/uL (ref 4.20–5.80)
RDW: 13.3 % (ref 11.0–15.0)
Total Lymphocyte: 16.6 %
WBC: 6 10*3/uL (ref 3.8–10.8)

## 2019-01-24 LAB — HEPATIC FUNCTION PANEL
AG Ratio: 1.8 (calc) (ref 1.0–2.5)
ALT: 10 U/L (ref 9–46)
AST: 13 U/L (ref 10–35)
Albumin: 4.5 g/dL (ref 3.6–5.1)
Alkaline phosphatase (APISO): 60 U/L (ref 35–144)
Bilirubin, Direct: 0.2 mg/dL (ref 0.0–0.2)
Globulin: 2.5 g/dL (calc) (ref 1.9–3.7)
Indirect Bilirubin: 0.5 mg/dL (calc) (ref 0.2–1.2)
Total Bilirubin: 0.7 mg/dL (ref 0.2–1.2)
Total Protein: 7 g/dL (ref 6.1–8.1)

## 2019-01-24 NOTE — Patient Instructions (Signed)
The risks of bleeding, perforation and infection were reviewed with patient.  

## 2019-01-24 NOTE — Progress Notes (Signed)
   Subjective:    Patient ID: Cody Johns, male    DOB: [birth year], 83 y.o.   MRN: [id number]  HPI Referred by Dr. Gerarda Fraction for abdominal pain/colonoscopy. Patient is scheduled for a CT on 02/01/2019. Hx of IBS. He tells me he developed excessive gas and diarrhea. Uses generic Imodium which helps. He is still experiencing excessive. Has had symptoms for greater than 20 yrs. He also says he has noticed some LUQ pain x one month. No injury that he know of. Symptoms when he lies on his left side in bed. Has not had the pain in about a week.  CT abdomen/pelvis w CM ordered 02/01/2019 by PCP.  Weight loss due to new onset of diabetes with change in diet and hip fracture last year. Diabetes for a couple of years. Per epic last colonoscopy in 2018 with a hyperplastic polyp.  Review of Systems Past Medical History:  Diagnosis Date  . Degenerative joint disease involving multiple joints   . Diabetes ([place])   . GERD (gastroesophageal reflux disease)   . Hyperglyceridemia   . IBS (irritable bowel syndrome)       No Known Allergies  Current Outpatient Medications on File Prior to Visit  Medication Sig Dispense Refill  . loperamide (IMODIUM) 2 MG capsule Take by mouth as needed for diarrhea or loose stools.    . sitaGLIPtin-metformin (JANUMET) 50-1000 MG tablet Take 1 tablet by mouth daily.     No current facility-administered medications on file prior to visit.         Objective:   Physical Exam Blood pressure 118/66, pulse 83, temperature 98.4 F (36.9 C), height 6' (1.829 m), weight 184 lb (83.5 kg). Alert and oriented. Skin warm and dry. Oral mucosa is moist.   . Sclera anicteric, conjunctivae is pink. Thyroid not enlarged. No cervical lymphadenopathy. Lungs clear. Heart regular rate and rhythm.  Abdomen is soft. Bowel sounds are positive. No hepatomegaly. No abdominal masses felt. No tenderness.  No edema to lower extremities.         Assessment & Plan:  LUQ pain. CT abdomen/pelvis  ordered by PCP. CBC and Hepatic ordered. Screening colonoscopy.

## 2019-02-01 ENCOUNTER — Ambulatory Visit (HOSPITAL_COMMUNITY)
Admission: RE | Admit: 2019-02-01 | Discharge: 2019-02-01 | Disposition: A | Discharge status: Home / Self Care | Payer: PPO | Source: Ambulatory Visit | Attending: Internal Medicine | Admitting: Internal Medicine

## 2019-02-01 ENCOUNTER — Encounter (HOSPITAL_COMMUNITY): Payer: Self-pay

## 2019-02-01 ENCOUNTER — Other Ambulatory Visit: Payer: Self-pay

## 2019-02-01 DIAGNOSIS — R109 Unspecified abdominal pain: Secondary | ICD-10-CM | POA: Insufficient documentation

## 2019-02-01 LAB — POCT I-STAT CREATININE: Creatinine, Ser: 0.9 mg/dL (ref 0.61–1.24)

## 2019-02-01 MED ORDER — IOHEXOL 300 MG/ML  SOLN
100.0000 mL | Freq: Once | INTRAMUSCULAR | Status: AC | PRN
  Administered 2019-02-01: 09:00:00 100 mL via INTRAVENOUS

## 2019-02-05 ENCOUNTER — Other Ambulatory Visit (INDEPENDENT_AMBULATORY_CARE_PROVIDER_SITE_OTHER): Payer: Self-pay | Admitting: Internal Medicine

## 2019-02-05 DIAGNOSIS — Z1211 Encounter for screening for malignant neoplasm of colon: Secondary | ICD-10-CM | POA: Insufficient documentation

## 2019-03-27 ENCOUNTER — Telehealth (INDEPENDENT_AMBULATORY_CARE_PROVIDER_SITE_OTHER): Payer: Self-pay | Admitting: *Deleted

## 2019-03-27 ENCOUNTER — Encounter (INDEPENDENT_AMBULATORY_CARE_PROVIDER_SITE_OTHER): Payer: Self-pay | Admitting: *Deleted

## 2019-03-27 NOTE — Telephone Encounter (Signed)
Patient needs trilyte 

## 2019-03-28 MED ORDER — PEG 3350-KCL-NA BICARB-NACL 420 G PO SOLR: 4000.0000 mL | Freq: Once | ORAL | 0 refills | Status: AC

## 2019-04-29 ENCOUNTER — Other Ambulatory Visit (HOSPITAL_COMMUNITY)
Admission: RE | Admit: 2019-04-29 | Discharge: 2019-04-29 | Disposition: A | Discharge status: Home / Self Care | Payer: PPO | Source: Ambulatory Visit | Attending: Internal Medicine | Admitting: Internal Medicine

## 2019-04-29 ENCOUNTER — Other Ambulatory Visit: Payer: Self-pay

## 2019-05-01 ENCOUNTER — Encounter (HOSPITAL_COMMUNITY): Admission: RE | Payer: Self-pay | Source: Home / Self Care

## 2019-05-01 ENCOUNTER — Ambulatory Visit (HOSPITAL_COMMUNITY): Admission: RE | Admit: 2019-05-01 | Payer: PPO | Source: Home / Self Care | Admitting: Internal Medicine

## 2019-05-01 SURGERY — COLONOSCOPY
Anesthesia: Moderate Sedation

## 2019-05-03 DIAGNOSIS — R221 Localized swelling, mass and lump, neck: Secondary | ICD-10-CM | POA: Diagnosis not present

## 2019-05-03 DIAGNOSIS — Z9089 Acquired absence of other organs: Secondary | ICD-10-CM | POA: Diagnosis not present

## 2019-05-03 DIAGNOSIS — H6122 Impacted cerumen, left ear: Secondary | ICD-10-CM | POA: Diagnosis not present

## 2019-05-09 ENCOUNTER — Other Ambulatory Visit: Payer: Self-pay | Admitting: Otolaryngology

## 2019-05-09 DIAGNOSIS — R221 Localized swelling, mass and lump, neck: Secondary | ICD-10-CM

## 2019-05-16 ENCOUNTER — Other Ambulatory Visit: Payer: Self-pay

## 2019-05-16 ENCOUNTER — Ambulatory Visit
Admission: RE | Admit: 2019-05-16 | Discharge: 2019-05-16 | Disposition: A | Discharge status: Home / Self Care | Payer: PPO | Source: Ambulatory Visit | Attending: Otolaryngology | Admitting: Otolaryngology

## 2019-05-16 DIAGNOSIS — R221 Localized swelling, mass and lump, neck: Secondary | ICD-10-CM | POA: Diagnosis not present

## 2019-05-16 MED ORDER — IOPAMIDOL (ISOVUE-300) INJECTION 61%
75.0000 mL | Freq: Once | INTRAVENOUS | Status: AC | PRN
  Administered 2019-05-16: 75 mL via INTRAVENOUS

## 2019-05-17 DIAGNOSIS — S72041D Displaced fracture of base of neck of right femur, subsequent encounter for closed fracture with routine healing: Secondary | ICD-10-CM | POA: Diagnosis not present

## 2019-05-17 DIAGNOSIS — Z96641 Presence of right artificial hip joint: Secondary | ICD-10-CM | POA: Diagnosis not present

## 2019-05-22 ENCOUNTER — Ambulatory Visit: Payer: PPO | Attending: Orthopedic Surgery | Admitting: Physical Therapy

## 2019-05-22 ENCOUNTER — Encounter: Payer: Self-pay | Admitting: Physical Therapy

## 2019-05-22 ENCOUNTER — Other Ambulatory Visit: Payer: Self-pay

## 2019-05-22 DIAGNOSIS — M25551 Pain in right hip: Secondary | ICD-10-CM

## 2019-05-22 DIAGNOSIS — M6281 Muscle weakness (generalized): Secondary | ICD-10-CM | POA: Diagnosis not present

## 2019-05-22 NOTE — Therapy (Signed)
[facility] [place], [place], [address] Phone: [phone]   Fax:  [phone]  Physical Therapy Evaluation  Patient Details  Name: KAYLUB CARRAZCO MRN: [id number] Date of Birth: 1936-05-30 Referring Provider (PT): Gaynelle Arabian, MD   Encounter Date: 05/22/2019  PT End of Session - 05/22/19 1952    Visit Number  1    Number of Visits  8    Date for PT Re-Evaluation  06/26/19    Authorization Type  FOTO; progress note every 10th visit    PT Start Time  1116    PT Stop Time  1200    PT Time Calculation (min)  44 min    Activity Tolerance  Patient tolerated treatment well    Behavior During Therapy  [facility] for tasks assessed/performed       Past Medical History:  Diagnosis Date  . Degenerative joint disease involving multiple joints   . Diabetes ([place])   . GERD (gastroesophageal reflux disease)   . Hyperglyceridemia   . IBS (irritable bowel syndrome)     Past Surgical History:  Procedure Laterality Date  . BACK SURGERY  08/2005   SPINE SURGERY FOR DISC DISEASE  . Rt hip replacement     2019    There were no vitals filed for this visit.   Subjective Assessment - 05/22/19 1936    Subjective  COVID-19 screening performed upon arrival.Patient arrives to physical therapy with reports of right glute pain particularly while sitting after a right THA on 05/31/2018. Patient reports soreness turns into pain when sitting for 15-20 minutes. Patient also reports difficulties with lying on his right side while sleeping. Patient reports since surgery, his leg feels like it "locks up" and he observed a sizable bruise in his glute region. Patient reports pain at worst as a 5/10 and pain at best 2/10. Patient's goals are to decrease pain, improve movement, improve ability to perform home activities, and sit with minimal pain.    Pertinent History  Right THA 05/31/2018, DM    Limitations  Sitting;House hold activities    How long can you  sit comfortably?  15-20 mins    Diagnostic tests  x-ray: stable hip, normal results    Patient Stated Goals  decrease hip pain, drive without soreness and pain.    Currently in Pain?  Yes    Pain Score  5     Pain Location  Buttocks    Pain Orientation  Right    Pain Descriptors / Indicators  Sore;Aching    Pain Type  Surgical pain    Pain Radiating Towards  lateral R hip    Pain Onset  More than a month ago    Pain Frequency  Constant    Aggravating Factors   sitting for long periods of time    Effect of Pain on Daily Activities  sitting, driving         [facility] PT Assessment - 05/22/19 0001      Assessment   Medical Diagnosis  Presence of right artificial hip joint    Referring Provider (PT)  Gaynelle Arabian, MD    Onset Date/Surgical Date  05/31/18    Next MD Visit  06/28/2019    Prior Therapy  yes      Precautions   Precautions  Posterior Hip      Balance Screen   Has the patient fallen in the past 6 months  Yes    How many times?  1  Has the patient had a decrease in activity level because of a fear of falling?   Yes    Is the patient reluctant to leave their home because of a fear of falling?   No      Home Film/video editor residence      Prior Function   Level of Independence  Independent      Observation/Other Assessments   Focus on Therapeutic Outcomes (FOTO)   to be completed next visit      ROM / Strength   AROM / PROM / Strength  Strength      Strength   Strength Assessment Site  Hip    Right/Left Hip  Right;Left    Right Hip Flexion  4-/5    Right Hip Extension  4-/5    Right Hip ABduction  4-/5    Left Hip Flexion  4/5    Left Hip Extension  4/5    Left Hip ABduction  4/5      Palpation   Palpation comment  very tender to palpation to right medial glute, increased tension of lateral hamstring tendon, increased active trigger points along ITB      Transfers   Five time sit to stand comments   modified 11 seconds       Ambulation/Gait   Gait Pattern  Step-through pattern;Decreased stride length;Decreased stance time - right;Decreased hip/knee flexion - right                Objective measurements completed on examination: See above findings.              PT Education - 05/22/19 1949    Education Details  glute set, hip adduction, hip abduction, seated hamstring stretch    Person(s) Educated  Patient    Methods  Explanation;Demonstration;Handout    Comprehension  Verbalized understanding;Returned demonstration          PT Long Term Goals - 05/22/19 1959      PT LONG TERM GOAL #1   Title  Patient will be independent with HEP.    Time  4    Period  Weeks    Status  New      PT LONG TERM GOAL #2   Title  Patient will report ability to sit/drive for 25 minutes or greater with glute/hip pain less than or equal to 3/10    Time  4    Period  Weeks    Status  New      PT LONG TERM GOAL #3   Title  Patient will demonstrate 4+/5 or greater right hip MMT to improve stability during functional tasks.    Time  4    Period  Weeks    Status  New      PT LONG TERM GOAL #4   Title  Patient will report ability to perform ADLs and home activities with right hip/glute pain less than or equal to 2/10.    Time  4    Period  Weeks    Status  New             Plan - 05/22/19 1955    Clinical Impression Statement  Patient is an 83 year old male who presents to physical therapy with right glute pain, decreased right hip MMT, and decreased right hamstring flexibility. Patient tender to deep palpation to right glute and right ITB with notable lateral hamstring tension. Patient observed sitting with increased left weight shift.  Patient and PT discussed HEP and importance to maximize physical therapy benefit. Patient would benefit from skilled physical therapy to address deficits and address patient's goals.    Personal Factors and Comorbidities  Age;Comorbidity 1    Comorbidities  R THA  05/31/2018; DM    Examination-Activity Limitations  Sit    Examination-Participation Restrictions  Driving    Stability/Clinical Decision Making  Stable/Uncomplicated    Clinical Decision Making  Low    Rehab Potential  Good    PT Frequency  2x / week    PT Duration  4 weeks    PT Treatment/Interventions  ADLs/Self Care Home Management;Cryotherapy;Electrical Stimulation;Iontophoresis 4mg /ml Dexamethasone;Moist Heat;Gait training;Stair training;Balance training;Therapeutic exercise;Therapeutic activities;Functional mobility training;Neuromuscular re-education;Manual techniques;Patient/family education;Passive range of motion;Scar mobilization    PT Next Visit Plan  FOTO next visit; Nustep; gentle hip strengthening, hamstring stretching, STW/M to right glute, piriformis region, modalities PRN for pain relief.    PT Home Exercise Plan  see patient education section    Consulted and Agree with Plan of Care  Patient       Patient will benefit from skilled therapeutic intervention in order to improve the following deficits and impairments:  Decreased activity tolerance, Decreased strength, Decreased range of motion, Difficulty walking, Pain  Visit Diagnosis: Pain in right hip  Muscle weakness (generalized)     Problem List Patient Active Problem List   Diagnosis Date Noted  . Encounter for screening colonoscopy 02/05/2019  . Hyperglyceridemia   . IBS (irritable bowel syndrome)   . Degenerative joint disease involving multiple joints     Gabriela Eves, PT, DPT 05/22/2019, 8:45 PM  [facility] [address] [place], [place], [address] Phone: [phone]   Fax:  [phone]  Name: DONTREAL TAUTE MRN: [id number] Date of Birth: [birth year]

## 2019-05-24 ENCOUNTER — Encounter: Payer: Self-pay | Admitting: *Deleted

## 2019-05-24 ENCOUNTER — Ambulatory Visit: Payer: PPO | Admitting: *Deleted

## 2019-05-24 ENCOUNTER — Other Ambulatory Visit: Payer: Self-pay

## 2019-05-24 DIAGNOSIS — M25551 Pain in right hip: Secondary | ICD-10-CM

## 2019-05-24 DIAGNOSIS — M6281 Muscle weakness (generalized): Secondary | ICD-10-CM

## 2019-05-24 NOTE — Therapy (Signed)
[facility] [place], [place], [address] Phone: [phone]   Fax:  [phone]  Physical Therapy Treatment  Patient Details  Name: Cody Johns MRN: [id number] Date of Birth: 22-Dec-1935 Referring Provider (PT): Gaynelle Arabian, MD   Encounter Date: 05/24/2019  PT End of Session - 05/24/19 1226    Visit Number  2    Number of Visits  8    Date for PT Re-Evaluation  06/26/19    Authorization Type  FOTO; progress note every 10th visit    PT Start Time  1115    PT Stop Time  1205    PT Time Calculation (min)  50 min       Past Medical History:  Diagnosis Date  . Degenerative joint disease involving multiple joints   . Diabetes ([place])   . GERD (gastroesophageal reflux disease)   . Hyperglyceridemia   . IBS (irritable bowel syndrome)     Past Surgical History:  Procedure Laterality Date  . BACK SURGERY  08/2005   SPINE SURGERY FOR DISC DISEASE  . Rt hip replacement     2019    There were no vitals filed for this visit.                    [facility] Adult PT Treatment/Exercise - 05/24/19 0001      Exercises   Exercises  Knee/Hip;Lumbar      Lumbar Exercises: Aerobic   Nustep  L5 x 10 mins      Lumbar Exercises: Supine   Clam  20 reps   with Red tband   Bridge  20 reps;10 reps   3x10   Other Supine Lumbar Exercises  ball squeeze 2x10 hold 5 secs      Modalities   Modalities  Moist Heat      Moist Heat Therapy   Number Minutes Moist Heat  10 Minutes    Moist Heat Location  Hip      Manual Therapy   Manual Therapy  Soft tissue mobilization    Soft tissue mobilization  STW to RT glute and piriformis with light to medium pressure with Pt in LT sidelying                  PT Long Term Goals - 05/22/19 1959      PT LONG TERM GOAL #1   Title  Patient will be independent with HEP.    Time  4    Period  Weeks    Status  New      PT LONG TERM GOAL #2   Title  Patient will  report ability to sit/drive for 25 minutes or greater with glute/hip pain less than or equal to 3/10    Time  4    Period  Weeks    Status  New      PT LONG TERM GOAL #3   Title  Patient will demonstrate 4+/5 or greater right hip MMT to improve stability during functional tasks.    Time  4    Period  Weeks    Status  New      PT LONG TERM GOAL #4   Title  Patient will report ability to perform ADLs and home activities with right hip/glute pain less than or equal to 2/10.    Time  4    Period  Weeks    Status  New  Plan - 05/24/19 1118    Clinical Impression Statement  Pt arrived today doing fairly well with mainly soreness in RT hip. He was able to complete all therex with minimal complaints. Notable soreness along RT glute and piriformis muscles during STW, but tolerated well.Normal modality response after removal of HMP.    Personal Factors and Comorbidities  Age;Comorbidity 1    Comorbidities  R THA 05/31/2018; DM    Examination-Activity Limitations  Sit    Examination-Participation Restrictions  Driving    Stability/Clinical Decision Making  Stable/Uncomplicated    Rehab Potential  Good    PT Frequency  2x / week    PT Duration  4 weeks    PT Treatment/Interventions  ADLs/Self Care Home Management;Cryotherapy;Electrical Stimulation;Iontophoresis 4mg /ml Dexamethasone;Moist Heat;Gait training;Stair training;Balance training;Therapeutic exercise;Therapeutic activities;Functional mobility training;Neuromuscular re-education;Manual techniques;Patient/family education;Passive range of motion;Scar mobilization    PT Next Visit Plan  FOTO next visit; Nustep; gentle hip strengthening, hamstring stretching, STW/M to right glute, piriformis region, modalities PRN for pain relief.    PT Home Exercise Plan  see patient education section       Patient will benefit from skilled therapeutic intervention in order to improve the following deficits and impairments:  Decreased  activity tolerance, Decreased strength, Decreased range of motion, Difficulty walking, Pain  Visit Diagnosis: Pain in right hip  Muscle weakness (generalized)     Problem List Patient Active Problem List   Diagnosis Date Noted  . Encounter for screening colonoscopy 02/05/2019  . Hyperglyceridemia   . IBS (irritable bowel syndrome)   . Degenerative joint disease involving multiple joints     ,[name] 05/24/2019, 12:34 PM  [facility] [address] [place], [place], [address] Phone: [phone]   Fax:  [phone]  Name: Cody Johns MRN: [id number] Date of Birth: July 29, 1936

## 2019-05-29 ENCOUNTER — Encounter: Payer: Self-pay | Admitting: Physical Therapy

## 2019-05-29 ENCOUNTER — Other Ambulatory Visit: Payer: Self-pay

## 2019-05-29 ENCOUNTER — Ambulatory Visit: Payer: PPO | Admitting: Physical Therapy

## 2019-05-29 DIAGNOSIS — M6281 Muscle weakness (generalized): Secondary | ICD-10-CM

## 2019-05-29 DIAGNOSIS — M25551 Pain in right hip: Secondary | ICD-10-CM

## 2019-05-29 NOTE — Therapy (Signed)
[facility] [place], [place], [address] Phone: [phone]   Fax:  [phone]  Physical Therapy Treatment  Patient Details  Name: Cody Johns MRN: [id number] Date of Birth: 10/20/1935 Referring Provider (PT): Gaynelle Arabian, MD   Encounter Date: 05/29/2019  PT End of Session - 05/29/19 1223    Visit Number  3    Number of Visits  8    Date for PT Re-Evaluation  06/26/19    Authorization Type  FOTO; progress note every 10th visit    PT Start Time  1118    PT Stop Time  1205    PT Time Calculation (min)  47 min    Activity Tolerance  Patient tolerated treatment well    Behavior During Therapy  [facility] for tasks assessed/performed       Past Medical History:  Diagnosis Date  . Degenerative joint disease involving multiple joints   . Diabetes ([place])   . GERD (gastroesophageal reflux disease)   . Hyperglyceridemia   . IBS (irritable bowel syndrome)     Past Surgical History:  Procedure Laterality Date  . BACK SURGERY  08/2005   SPINE SURGERY FOR DISC DISEASE  . Rt hip replacement     2019    There were no vitals filed for this visit.  Subjective Assessment - 05/29/19 1119    Subjective  COVID 19 screening performed on patient upon arrival. Reports he has some soreness but only has pain if pressure applied while sitting for a long period of time. Reports balance issues as well.    Pertinent History  Right THA 05/31/2018, DM    Limitations  Sitting;House hold activities    How long can you sit comfortably?  15-20 mins    Diagnostic tests  x-ray: stable hip, normal results    Patient Stated Goals  decrease hip pain, drive without soreness and pain.    Currently in Pain?  Other (Comment)   No pain assessment provided by patient        [facility] PT Assessment - 05/29/19 0001      Assessment   Medical Diagnosis  Presence of right artificial hip joint    Referring Provider (PT)  Gaynelle Arabian, MD    Onset  Date/Surgical Date  05/31/18    Next MD Visit  06/28/2019    Prior Therapy  yes      Precautions   Precautions  Posterior Hip                   [facility] Adult PT Treatment/Exercise - 05/29/19 0001      Knee/Hip Exercises: Aerobic   Nustep  L5 x10 min      Knee/Hip Exercises: Seated   Long Arc Quad  Strengthening;Right;3 sets;10 reps;Weights    Long Arc Quad Weight  3 lbs.    Ball Squeeze  x20 reps 5 sec holds     Clamshell with TheraBand  Red   x30 reps   Hamstring Curl  Strengthening;Right;3 sets;10 reps;Limitations    Hamstring Limitations  red theraband      Knee/Hip Exercises: Supine   Bridges  Strengthening;Both;15 reps    Straight Leg Raises  AROM;Right;10 reps      Manual Therapy   Manual Therapy  Soft tissue mobilization    Soft tissue mobilization  STW to R piriformis, glute to reduce muscle tightness                  PT Long  Term Goals - 05/22/19 1959      PT LONG TERM GOAL #1   Title  Patient will be independent with HEP.    Time  4    Period  Weeks    Status  New      PT LONG TERM GOAL #2   Title  Patient will report ability to sit/drive for 25 minutes or greater with glute/hip pain less than or equal to 3/10    Time  4    Period  Weeks    Status  New      PT LONG TERM GOAL #3   Title  Patient will demonstrate 4+/5 or greater right hip MMT to improve stability during functional tasks.    Time  4    Period  Weeks    Status  New      PT LONG TERM GOAL #4   Title  Patient will report ability to perform ADLs and home activities with right hip/glute pain less than or equal to 2/10.    Time  4    Period  Weeks    Status  New            Plan - 05/29/19 1226    Clinical Impression Statement  Patient presented in clinic with continued reports of R buttock pain with radiating RLE symptoms intermittantly. Patient able to complete all therex well with no complaints of pain. Patient educated throughout treatment of possibility of  Piriformis or sciatic involvement. Patient very tender to palpation of R piriformis and glute region. No complaints following end of treatment.    Personal Factors and Comorbidities  Age;Comorbidity 1    Comorbidities  R THA 05/31/2018; DM    Examination-Activity Limitations  Sit    Examination-Participation Restrictions  Driving    Stability/Clinical Decision Making  Stable/Uncomplicated    Rehab Potential  Good    PT Frequency  2x / week    PT Duration  4 weeks    PT Treatment/Interventions  ADLs/Self Care Home Management;Cryotherapy;Electrical Stimulation;Iontophoresis 4mg /ml Dexamethasone;Moist Heat;Gait training;Stair training;Balance training;Therapeutic exercise;Therapeutic activities;Functional mobility training;Neuromuscular re-education;Manual techniques;Patient/family education;Passive range of motion;Scar mobilization    PT Next Visit Plan  Progress to machine hip strengthening and balance training. Monitor piriformis tightness.    PT Home Exercise Plan  see patient education section    Consulted and Agree with Plan of Care  Patient       Patient will benefit from skilled therapeutic intervention in order to improve the following deficits and impairments:  Decreased activity tolerance, Decreased strength, Decreased range of motion, Difficulty walking, Pain  Visit Diagnosis: Pain in right hip  Muscle weakness (generalized)     Problem List Patient Active Problem List   Diagnosis Date Noted  . Encounter for screening colonoscopy 02/05/2019  . Hyperglyceridemia   . IBS (irritable bowel syndrome)   . Degenerative joint disease involving multiple joints     Standley Brooking, PTA 05/29/2019, 12:32 PM  [facility] [address] [place], [place], [address] Phone: [phone]   Fax:  [phone]  Name: IZEKIEL GERARDOT MRN: [id number] Date of Birth: [birth year]

## 2019-05-31 ENCOUNTER — Encounter: Payer: Self-pay | Admitting: *Deleted

## 2019-05-31 ENCOUNTER — Ambulatory Visit: Payer: PPO | Attending: Orthopedic Surgery | Admitting: *Deleted

## 2019-05-31 ENCOUNTER — Other Ambulatory Visit: Payer: Self-pay

## 2019-05-31 DIAGNOSIS — M6281 Muscle weakness (generalized): Secondary | ICD-10-CM

## 2019-05-31 DIAGNOSIS — M25551 Pain in right hip: Secondary | ICD-10-CM | POA: Diagnosis not present

## 2019-05-31 NOTE — Therapy (Signed)
[facility] [place], [place], [address] Phone: [phone]   Fax:  [phone]  Physical Therapy Treatment  Patient Details  Name: Cody Johns MRN: [id number] Date of Birth: [birth year] Referring Provider (PT): Cody Arabian, MD   Encounter Date: 05/31/2019  PT End of Session - 05/31/19 1215    Visit Number  4    Number of Visits  8    Date for PT Re-Evaluation  06/26/19    Authorization Type  FOTO; progress note every 10th visit    PT Start Time  1115    PT Stop Time  1206    PT Time Calculation (min)  51 min       Past Medical History:  Diagnosis Date  . Degenerative joint disease involving multiple joints   . Diabetes ([place])   . GERD (gastroesophageal reflux disease)   . Hyperglyceridemia   . IBS (irritable bowel syndrome)     Past Surgical History:  Procedure Laterality Date  . BACK SURGERY  08/2005   SPINE SURGERY FOR DISC DISEASE  . Rt hip replacement     2019    There were no vitals filed for this visit.  Subjective Assessment - 05/31/19 1118    Subjective  COVID 19 screening performed on patient upon arrival. Reports he has some soreness but only has pain if pressure applied while sitting for a long period of time.    Pertinent History  Right THA 05/31/2018, DM    Limitations  Sitting;House hold activities    How long can you sit comfortably?  15-20 mins    Patient Stated Goals  decrease hip pain, drive without soreness and pain.    Pain Score  2     Pain Location  Buttocks    Pain Orientation  Right    Pain Descriptors / Indicators  Sore    Pain Type  Acute pain    Pain Onset  More than a month ago                       [facility] Adult PT Treatment/Exercise - 05/31/19 0001      Exercises   Exercises  Knee/Hip;Lumbar      Lumbar Exercises: Aerobic   Nustep  --      Knee/Hip Exercises: Aerobic   Nustep  L5 x10 min      Knee/Hip Exercises: Standing   Forward Step Up   Right;3 sets;10 reps;Step Height: 6";Hand Hold: 1    Rocker Board  5 minutes   balance   Other Standing Knee Exercises  8 in toe taps 3x10, one step holds x4 30 sec holds      Knee/Hip Exercises: Seated   Long Arc Quad  --    Long Arc [employer]  --    Ball Squeeze  --    Clamshell with TheraBand  --    Hamstring Curl  --    Hamstring Limitations  --      Manual Therapy   Manual Therapy  Soft tissue mobilization    Soft tissue mobilization  STW to R piriformis, glute to reduce muscle tightness with Pt in LT sidelying                  PT Long Term Goals - 05/22/19 1959      PT LONG TERM GOAL #1   Title  Patient will be independent with HEP.    Time  4  Period  Weeks    Status  New      PT LONG TERM GOAL #2   Title  Patient will report ability to sit/drive for 25 minutes or greater with glute/hip pain less than or equal to 3/10    Time  4    Period  Weeks    Status  New      PT LONG TERM GOAL #3   Title  Patient will demonstrate 4+/5 or greater right hip MMT to improve stability during functional tasks.    Time  4    Period  Weeks    Status  New      PT LONG TERM GOAL #4   Title  Patient will report ability to perform ADLs and home activities with right hip/glute pain less than or equal to 2/10.    Time  4    Period  Weeks    Status  New            Plan - 05/31/19 1218    Clinical Impression Statement  Pt arrived today with mainly soreness in Rt hip. He was able to complete all strengthening exs and balance act.'s with no complaints. He reports he can mainly tell that his RT LE is weaker. Some notable tightness/soreness in RT piriformis /glute during STW.    Personal Factors and Comorbidities  Age;Comorbidity 1    Comorbidities  R THA 05/31/2018; DM    Examination-Activity Limitations  Sit    Examination-Participation Restrictions  Driving    Stability/Clinical Decision Making  Stable/Uncomplicated    Rehab Potential  Good    PT Frequency  2x /  week    PT Duration  4 weeks    PT Treatment/Interventions  ADLs/Self Care Home Management;Cryotherapy;Electrical Stimulation;Iontophoresis 4mg /ml Dexamethasone;Moist Heat;Gait training;Stair training;Balance training;Therapeutic exercise;Therapeutic activities;Functional mobility training;Neuromuscular re-education;Manual techniques;Patient/family education;Passive range of motion;Scar mobilization    PT Next Visit Plan  Progress to machine hip strengthening and balance training. Monitor piriformis tightness.    PT Home Exercise Plan  see patient education section    Consulted and Agree with Plan of Care  Patient       Patient will benefit from skilled therapeutic intervention in order to improve the following deficits and impairments:  Decreased activity tolerance, Decreased strength, Decreased range of motion, Difficulty walking, Pain  Visit Diagnosis: Pain in right hip  Muscle weakness (generalized)     Problem List Patient Active Problem List   Diagnosis Date Noted  . Encounter for screening colonoscopy 02/05/2019  . Hyperglyceridemia   . IBS (irritable bowel syndrome)   . Degenerative joint disease involving multiple joints     Romon Johns,Cody Johns, PTA 05/31/2019, 12:28 PM  [facility] [address] [place], [place], [address] Phone: [phone]   Fax:  [phone]  Name: Cody Johns MRN: [id number] Date of Birth: [birth year]

## 2019-06-05 ENCOUNTER — Ambulatory Visit: Payer: PPO | Admitting: Physical Therapy

## 2019-06-05 ENCOUNTER — Encounter: Payer: Self-pay | Admitting: Physical Therapy

## 2019-06-05 ENCOUNTER — Other Ambulatory Visit: Payer: Self-pay

## 2019-06-05 DIAGNOSIS — M25551 Pain in right hip: Secondary | ICD-10-CM | POA: Diagnosis not present

## 2019-06-05 DIAGNOSIS — M6281 Muscle weakness (generalized): Secondary | ICD-10-CM

## 2019-06-05 NOTE — Therapy (Signed)
[facility] [place], [place], [address] Phone: [phone]   Fax:  [phone]  Physical Therapy Treatment  Patient Details  Name: Cody Johns MRN: [id number] Date of Birth: Mar 22, 1936 Referring Provider (PT): Gaynelle Arabian, MD   Encounter Date: 06/05/2019  PT End of Session - 06/05/19 1233    Visit Number  5    Number of Visits  8    Date for PT Re-Evaluation  06/26/19    Authorization Type  FOTO; progress note every 10th visit    PT Start Time  1115    PT Stop Time  1205    PT Time Calculation (min)  50 min    Behavior During Therapy  [facility] for tasks assessed/performed       Past Medical History:  Diagnosis Date  . Degenerative joint disease involving multiple joints   . Diabetes ([place])   . GERD (gastroesophageal reflux disease)   . Hyperglyceridemia   . IBS (irritable bowel syndrome)     Past Surgical History:  Procedure Laterality Date  . BACK SURGERY  08/2005   SPINE SURGERY FOR DISC DISEASE  . Rt hip replacement     2019    There were no vitals filed for this visit.  Subjective Assessment - 06/05/19 1226    Subjective  COVID 19 screening performed on patient upon arrival. Patient reports feeling good and therapy has been helping but still with pain with pressure.    Pertinent History  Right THA 05/31/2018, DM    Limitations  Sitting;House hold activities    How long can you sit comfortably?  15-20 mins    Diagnostic tests  x-ray: stable hip, normal results    Patient Stated Goals  decrease hip pain, drive without soreness and pain.    Currently in Pain?  No/denies         [facility] PT Assessment - 06/05/19 0001      Assessment   Medical Diagnosis  Presence of right artificial hip joint    Referring Provider (PT)  Gaynelle Arabian, MD    Onset Date/Surgical Date  05/31/18    Next MD Visit  06/28/2019    Prior Therapy  yes      Precautions   Precautions  Posterior Hip                    [facility] Adult PT Treatment/Exercise - 06/05/19 0001      Exercises   Exercises  Knee/Hip;Lumbar      Knee/Hip Exercises: Aerobic   Stationary Bike  Level 5 x10 minutes       Knee/Hip Exercises: Standing   Hip Flexion  AROM;Both;20 reps;Knee bent    Forward Step Up  Right;3 sets;10 reps;Step Height: 6";Hand Hold: 1    Rocker Board  5 minutes   balance   Other Standing Knee Exercises  lateral stepping yellow theraband x3 minutes      Knee/Hip Exercises: Seated   Long Arc Quad  Strengthening;Right;2 sets;10 reps    Long Arc Quad Weight  3 lbs.      Manual Therapy   Manual Therapy  Soft tissue mobilization    Soft tissue mobilization  STW to R piriformis, glute to reduce muscle tightness with Pt in LT sidelying                  PT Long Term Goals - 06/05/19 1248      PT LONG TERM GOAL #1   Title  Patient will be independent with HEP.    Time  4    Period  Weeks    Status  On-going      PT LONG TERM GOAL #2   Title  Patient will report ability to sit/drive for 25 minutes or greater with glute/hip pain less than or equal to 3/10    Time  4    Period  Weeks    Status  On-going      PT LONG TERM GOAL #3   Title  Patient will demonstrate 4+/5 or greater right hip MMT to improve stability during functional tasks.    Time  4    Period  Weeks    Status  On-going      PT LONG TERM GOAL #4   Title  Patient will report ability to perform ADLs and home activities with right hip/glute pain less than or equal to 2/10.    Period  Weeks    Status  On-going            Plan - 06/05/19 1234    Clinical Impression Statement  Patient responded well to therapy with slight increase of soress in right hip. Patient was able to demonstrate good form with all exercises after explanation. Notable decrease in tone after STW/M to right piriformis in left sidelying.    Personal Factors and Comorbidities  Age;Comorbidity 1    Comorbidities  R THA  05/31/2018; DM    Examination-Activity Limitations  Sit    Examination-Participation Restrictions  Driving    Stability/Clinical Decision Making  Stable/Uncomplicated    Clinical Decision Making  Low    Rehab Potential  Good    PT Frequency  2x / week    PT Duration  4 weeks    PT Treatment/Interventions  ADLs/Self Care Home Management;Cryotherapy;Electrical Stimulation;Iontophoresis 4mg /ml Dexamethasone;Moist Heat;Gait training;Stair training;Balance training;Therapeutic exercise;Therapeutic activities;Functional mobility training;Neuromuscular re-education;Manual techniques;Patient/family education;Passive range of motion;Scar mobilization    PT Next Visit Plan  Progress to machine hip strengthening and balance training. Monitor piriformis tightness.    Consulted and Agree with Plan of Care  Patient       Patient will benefit from skilled therapeutic intervention in order to improve the following deficits and impairments:  Decreased activity tolerance, Decreased strength, Decreased range of motion, Difficulty walking, Pain  Visit Diagnosis: Pain in right hip  Muscle weakness (generalized)     Problem List Patient Active Problem List   Diagnosis Date Noted  . Encounter for screening colonoscopy 02/05/2019  . Hyperglyceridemia   . IBS (irritable bowel syndrome)   . Degenerative joint disease involving multiple joints     Gabriela Eves, PT, DPT 06/05/2019, 12:49 PM  [facility] [address] [place], [place], [address] Phone: [phone]   Fax:  [phone]  Name: CAHLIL DENNIE MRN: [id number] Date of Birth: March 21, 1936

## 2019-06-07 ENCOUNTER — Other Ambulatory Visit: Payer: Self-pay

## 2019-06-07 ENCOUNTER — Encounter: Payer: Self-pay | Admitting: *Deleted

## 2019-06-07 ENCOUNTER — Ambulatory Visit: Payer: PPO | Admitting: *Deleted

## 2019-06-07 DIAGNOSIS — M25551 Pain in right hip: Secondary | ICD-10-CM

## 2019-06-07 DIAGNOSIS — M6281 Muscle weakness (generalized): Secondary | ICD-10-CM

## 2019-06-07 NOTE — Therapy (Signed)
[facility] [place], [place], [address] Phone: [phone]   Fax:  [phone]  Physical Therapy Treatment  Patient Details  Name: Cody Johns MRN: [id number] Date of Birth: 04/27/1936 Referring Provider (PT): Gaynelle Arabian, MD   Encounter Date: 06/07/2019  PT End of Session - 06/07/19 1116    Visit Number  6    Number of Visits  8    Date for PT Re-Evaluation  06/26/19    PT Start Time  1115    PT Stop Time  [id number]    PT Time Calculation (min)  49 min       Past Medical History:  Diagnosis Date  . Degenerative joint disease involving multiple joints   . Diabetes ([place])   . GERD (gastroesophageal reflux disease)   . Hyperglyceridemia   . IBS (irritable bowel syndrome)     Past Surgical History:  Procedure Laterality Date  . BACK SURGERY  08/2005   SPINE SURGERY FOR DISC DISEASE  . Rt hip replacement     2019    There were no vitals filed for this visit.  Subjective Assessment - 06/07/19 1220    Subjective  COVID 19 screening performed on patient upon arrival. Patient reports feeling better with no episodes of sharp pain RT hip. Just soreness    Pertinent History  Right THA 05/31/2018, DM    Limitations  Sitting;House hold activities    How long can you sit comfortably?  15-20 mins    Diagnostic tests  x-ray: stable hip, normal results    Patient Stated Goals  decrease hip pain, drive without soreness and pain.    Currently in Pain?  No/denies    Pain Location  Buttocks    Pain Orientation  Right    Pain Descriptors / Indicators  Sore    Pain Type  Acute pain    Pain Onset  More than a month ago    Pain Frequency  Constant                       [facility] Adult PT Treatment/Exercise - 06/07/19 0001      Exercises   Exercises  Knee/Hip;Lumbar      Knee/Hip Exercises: Aerobic   Stationary Bike  Level 5 x10 minutes       Knee/Hip Exercises: Standing   Hip Flexion  AROM;Both;20 reps;Knee  bent   6in toe taps x 20 also SBA   Forward Step Up  Right;3 sets;10 reps;Step Height: 6";Hand Hold: 1    Rocker Board  5 minutes   balance   Other Standing Knee Exercises  lateral stepping in hallway SBA for balance      Knee/Hip Exercises: Seated   Long Arc Quad  10 reps;AROM;Right;3 sets    [employer] Weight  4 lbs.    Clamshell with TheraBand  Green   3x10     Knee/Hip Exercises: Supine   Bridges  Strengthening;Both;3 sets;10 reps      Knee/Hip Exercises: Sidelying   Hip ABduction  AROM;Right;3 sets;10 reps      Manual Therapy   Manual Therapy  Soft tissue mobilization    Soft tissue mobilization  STW to R piriformis and upper  glute to reduce muscle tightness with Pt in LT sidelying                  PT Long Term Goals - 06/05/19 1248      PT LONG  TERM GOAL #1   Title  Patient will be independent with HEP.    Time  4    Period  Weeks    Status  On-going      PT LONG TERM GOAL #2   Title  Patient will report ability to sit/drive for 25 minutes or greater with glute/hip pain less than or equal to 3/10    Time  4    Period  Weeks    Status  On-going      PT LONG TERM GOAL #3   Title  Patient will demonstrate 4+/5 or greater right hip MMT to improve stability during functional tasks.    Time  4    Period  Weeks    Status  On-going      PT LONG TERM GOAL #4   Title  Patient will report ability to perform ADLs and home activities with right hip/glute pain less than or equal to 2/10.    Period  Weeks    Status  On-going            Plan - 06/07/19 1117    Clinical Impression Statement  Pt. arrived today doing better with no episodes of sharp pain in RT hip for two weeks. Mainly soreness and weakness.Marland Kitchen He was able to perform all strengthening exs and balance act.'s with mainly fatigue and decreased assistance with balance. Still with soreness along piriformis mm during STW, but less.    Personal Factors and Comorbidities  Age;Comorbidity 1     Comorbidities  R THA 05/31/2018; DM    Examination-Activity Limitations  Sit    Examination-Participation Restrictions  Driving    Stability/Clinical Decision Making  Stable/Uncomplicated    Rehab Potential  Good    PT Frequency  2x / week    PT Treatment/Interventions  ADLs/Self Care Home Management;Cryotherapy;Electrical Stimulation;Iontophoresis 4mg /ml Dexamethasone;Moist Heat;Gait training;Stair training;Balance training;Therapeutic exercise;Therapeutic activities;Functional mobility training;Neuromuscular re-education;Manual techniques;Patient/family education;Passive range of motion;Scar mobilization    PT Next Visit Plan  Progress to machine hip strengthening and balance training. Monitor piriformis tightness.    PT Home Exercise Plan  see patient education section    Consulted and Agree with Plan of Care  Patient       Patient will benefit from skilled therapeutic intervention in order to improve the following deficits and impairments:  Decreased activity tolerance, Decreased strength, Decreased range of motion, Difficulty walking, Pain  Visit Diagnosis: Pain in right hip  Muscle weakness (generalized)     Problem List Patient Active Problem List   Diagnosis Date Noted  . Encounter for screening colonoscopy 02/05/2019  . Hyperglyceridemia   . IBS (irritable bowel syndrome)   . Degenerative joint disease involving multiple joints     ,[name], PTA 06/07/2019, 12:22 PM  [facility] [address] [place], [place], [address] Phone: [phone]   Fax:  [phone]  Name: Cody Johns MRN: [id number] Date of Birth: 06-05-1936

## 2019-06-12 ENCOUNTER — Ambulatory Visit: Payer: PPO | Admitting: Physical Therapy

## 2019-06-12 ENCOUNTER — Encounter: Payer: Self-pay | Admitting: Physical Therapy

## 2019-06-12 ENCOUNTER — Other Ambulatory Visit: Payer: Self-pay

## 2019-06-12 DIAGNOSIS — M6281 Muscle weakness (generalized): Secondary | ICD-10-CM

## 2019-06-12 DIAGNOSIS — M25551 Pain in right hip: Secondary | ICD-10-CM

## 2019-06-12 NOTE — Therapy (Signed)
[facility] [place], [place], [address] Phone: [phone]   Fax:  [phone]  Physical Therapy Treatment  Patient Details  Name: JETON SIBBETT MRN: [id number] Date of Birth: November 19, 1935 Referring Provider (PT): Gaynelle Arabian, MD   Encounter Date: 06/12/2019  PT End of Session - 06/12/19 1038    Visit Number  7    Number of Visits  8    Date for PT Re-Evaluation  06/26/19    Authorization Type  FOTO; progress note every 10th visit    PT Start Time  0945    PT Stop Time  1033    PT Time Calculation (min)  48 min    Activity Tolerance  Patient tolerated treatment well    Behavior During Therapy  [facility] for tasks assessed/performed       Past Medical History:  Diagnosis Date  . Degenerative joint disease involving multiple joints   . Diabetes ([place])   . GERD (gastroesophageal reflux disease)   . Hyperglyceridemia   . IBS (irritable bowel syndrome)     Past Surgical History:  Procedure Laterality Date  . BACK SURGERY  08/2005   SPINE SURGERY FOR DISC DISEASE  . Rt hip replacement     2019    There were no vitals filed for this visit.  Subjective Assessment - 06/12/19 1015    Subjective  COVID 19 screening performed on patient upon arrival. Patient reports feeling just soreness in right hip.    Pertinent History  Right THA 05/31/2018, DM    Limitations  Sitting;House hold activities    How long can you sit comfortably?  15-20 mins    Diagnostic tests  x-ray: stable hip, normal results    Patient Stated Goals  decrease hip pain, drive without soreness and pain.    Currently in Pain?  No/denies    Pain Onset  More than a month ago         [facility] PT Assessment - 06/12/19 0001      Assessment   Medical Diagnosis  Presence of right artificial hip joint    Referring Provider (PT)  Gaynelle Arabian, MD    Onset Date/Surgical Date  05/31/18    Next MD Visit  06/28/2019    Prior Therapy  yes      Precautions   Precautions  Posterior Hip                   [facility] Adult PT Treatment/Exercise - 06/12/19 0001      Exercises   Exercises  Knee/Hip;Lumbar      Knee/Hip Exercises: Aerobic   Nustep  L5 x15 min      Knee/Hip Exercises: Standing   Hip Flexion  AROM;Both;20 reps;Knee bent;Stengthening    Hip Flexion Limitations  2# weight    Hip Abduction  Stengthening;Both;3 sets;10 reps;Knee straight    Abduction Limitations  2# weight    Lateral Step Up  Both;2 sets;10 reps;Hand Hold: 1;Step Height: 6"   Lateral A stepping   Rocker Board  3 minutes      Manual Therapy   Manual Therapy  Soft tissue mobilization    Soft tissue mobilization  STW to R piriformis and upper  glute to reduce muscle tightness with Pt in LT sidelying                  PT Long Term Goals - 06/05/19 1248      PT LONG TERM GOAL #1  Title  Patient will be independent with HEP.    Time  4    Period  Weeks    Status  On-going      PT LONG TERM GOAL #2   Title  Patient will report ability to sit/drive for 25 minutes or greater with glute/hip pain less than or equal to 3/10    Time  4    Period  Weeks    Status  On-going      PT LONG TERM GOAL #3   Title  Patient will demonstrate 4+/5 or greater right hip MMT to improve stability during functional tasks.    Time  4    Period  Weeks    Status  On-going      PT LONG TERM GOAL #4   Title  Patient will report ability to perform ADLs and home activities with right hip/glute pain less than or equal to 2/10.    Period  Weeks    Status  On-going            Plan - 06/12/19 1039    Clinical Impression Statement  Patient responded well and reported feeling like "the muscles are working" with added resistance. Patient demonstrated good form with all exercises. Patient has one more visit then DC.    Personal Factors and Comorbidities  Age;Comorbidity 1    Comorbidities  R THA 05/31/2018; DM    Examination-Activity Limitations  Sit     Examination-Participation Restrictions  Driving    Stability/Clinical Decision Making  Stable/Uncomplicated    Clinical Decision Making  Low    Rehab Potential  Good    PT Frequency  2x / week    PT Duration  4 weeks    PT Treatment/Interventions  ADLs/Self Care Home Management;Cryotherapy;Electrical Stimulation;Iontophoresis 4mg /ml Dexamethasone;Moist Heat;Gait training;Stair training;Balance training;Therapeutic exercise;Therapeutic activities;Functional mobility training;Neuromuscular re-education;Manual techniques;Patient/family education;Passive range of motion;Scar mobilization    PT Next Visit Plan  FOTO; DC; Review hip machines and self piriformis release with tennis ball.    PT Home Exercise Plan  see patient education section    Consulted and Agree with Plan of Care  Patient       Patient will benefit from skilled therapeutic intervention in order to improve the following deficits and impairments:  Decreased activity tolerance, Decreased strength, Decreased range of motion, Difficulty walking, Pain  Visit Diagnosis: Pain in right hip  Muscle weakness (generalized)     Problem List Patient Active Problem List   Diagnosis Date Noted  . Encounter for screening colonoscopy 02/05/2019  . Hyperglyceridemia   . IBS (irritable bowel syndrome)   . Degenerative joint disease involving multiple joints     Gabriela Eves, PT, DPT 06/12/2019, 10:51 AM  [facility] [address] [place], [place], [address] Phone: [phone]   Fax:  [phone]  Name: DAMONTEZ BAYUK MRN: [id number] Date of Birth: 05-28-1936

## 2019-06-14 ENCOUNTER — Ambulatory Visit: Payer: PPO | Admitting: Physical Therapy

## 2019-06-14 ENCOUNTER — Encounter: Payer: Self-pay | Admitting: Physical Therapy

## 2019-06-14 ENCOUNTER — Other Ambulatory Visit: Payer: Self-pay

## 2019-06-14 DIAGNOSIS — M25551 Pain in right hip: Secondary | ICD-10-CM | POA: Diagnosis not present

## 2019-06-14 DIAGNOSIS — M6281 Muscle weakness (generalized): Secondary | ICD-10-CM

## 2019-06-14 NOTE — Therapy (Signed)
[facility] [place], [place], [address] Phone: [phone]   Fax:  [phone]  Physical Therapy Treatment  PHYSICAL THERAPY DISCHARGE SUMMARY  Visits from Start of Care: 8  Current functional level related to goals / functional outcomes: See below   Remaining deficits: See goals   Education / Equipment: HEP; red and green theraband Plan: Patient agrees to discharge.  Patient goals were partially met. Patient is being discharged due to being pleased with the current functional level.  ?????   Gabriela Eves, PT, DPT   Patient Details  Name: Cody Johns MRN: [id number] Date of Birth: 1936/05/25 Referring Provider (PT): Gaynelle Arabian, MD   Encounter Date: 06/14/2019  PT End of Session - 06/14/19 1030    Visit Number  8    Number of Visits  8    Date for PT Re-Evaluation  06/26/19    Authorization Type  FOTO; progress note every 10th visit    PT Start Time  0945    PT Stop Time  1030    PT Time Calculation (min)  45 min    Activity Tolerance  Patient tolerated treatment well    Behavior During Therapy  [facility] for tasks assessed/performed       Past Medical History:  Diagnosis Date  . Degenerative joint disease involving multiple joints   . Diabetes ([place])   . GERD (gastroesophageal reflux disease)   . Hyperglyceridemia   . IBS (irritable bowel syndrome)     Past Surgical History:  Procedure Laterality Date  . BACK SURGERY  08/2005   SPINE SURGERY FOR DISC DISEASE  . Rt hip replacement     2019    There were no vitals filed for this visit.  Subjective Assessment - 06/14/19 1103    Subjective  COVID 19 screening performed on patient upon arrival. Patient reported feeling good and is ready for discharge and ready to return to the [facility].    Pertinent History  Right THA 05/31/2018, DM    Limitations  Sitting;House hold activities    How long can you sit comfortably?  15-20 mins    Diagnostic tests   x-ray: stable hip, normal results    Patient Stated Goals  decrease hip pain, drive without soreness and pain.    Currently in Pain?  No/denies         [facility] PT Assessment - 06/14/19 0001      Assessment   Medical Diagnosis  Presence of right artificial hip joint    Referring Provider (PT)  Gaynelle Arabian, MD    Onset Date/Surgical Date  05/31/18    Next MD Visit  06/28/2019    Prior Therapy  yes      Precautions   Precautions  Posterior Hip                   [facility] Adult PT Treatment/Exercise - 06/14/19 0001      Exercises   Exercises  Knee/Hip;Lumbar      Knee/Hip Exercises: Aerobic   Nustep  L5 x15 min      Knee/Hip Exercises: Machines for Strengthening   Cybex Knee Extension  20# 2x10 then right eccentic control 2x10    Cybex Knee Flexion  40# 2x10 then right eccentic control 2x10    Cybex Leg Press  2 plates 2x10      Knee/Hip Exercises: Standing   Other Standing Knee Exercises  self MFR with racquet ball x3 minutes  PT Education - 06/14/19 1107    Education Details  eccentric knee flexion and extension machine, clam shells, lateral stepping with resistance, hip abd with resistance    Person(s) Educated  Patient    Methods  Explanation;Demonstration;Handout    Comprehension  Verbalized understanding;Returned demonstration          PT Long Term Goals - 06/14/19 1026      PT LONG TERM GOAL #1   Title  Patient will be independent with HEP.    Time  4    Period  Weeks    Status  Achieved      PT LONG TERM GOAL #2   Title  Patient will report ability to sit/drive for 25 minutes or greater with glute/hip pain less than or equal to 3/10    Time  4    Period  Weeks    Status  Partially Met      PT LONG TERM GOAL #3   Title  Patient will demonstrate 4+/5 or greater right hip MMT to improve stability during functional tasks.    Time  4    Period  Weeks    Status  Achieved      PT LONG TERM GOAL #4   Title  Patient will report  ability to perform ADLs and home activities with right hip/glute pain less than or equal to 2/10.    Time  4    Period  Weeks    Status  Partially Met            Plan - 06/14/19 1059    Clinical Impression Statement  Patient responded well to therapy session. Patient guided through LE machines for proper technique when he returns to the [facility]. Patient educated slow controlled movements with LE machines as well as eccentric control for progression of exercises. Patient reported understanding. Patient goals partially met.    Personal Factors and Comorbidities  Age;Comorbidity 1    Comorbidities  R THA 05/31/2018; DM    Examination-Activity Limitations  Sit    Examination-Participation Restrictions  Driving    Stability/Clinical Decision Making  Stable/Uncomplicated    Clinical Decision Making  Low    Rehab Potential  Good    PT Frequency  2x / week    PT Duration  4 weeks    PT Treatment/Interventions  ADLs/Self Care Home Management;Cryotherapy;Electrical Stimulation;Iontophoresis 70m/ml Dexamethasone;Moist Heat;Gait training;Stair training;Balance training;Therapeutic exercise;Therapeutic activities;Functional mobility training;Neuromuscular re-education;Manual techniques;Patient/family education;Passive range of motion;Scar mobilization    PT Next Visit Plan  D/C    Consulted and Agree with Plan of Care  Patient       Patient will benefit from skilled therapeutic intervention in order to improve the following deficits and impairments:  Decreased activity tolerance, Decreased strength, Decreased range of motion, Difficulty walking, Pain  Visit Diagnosis: Pain in right hip  Muscle weakness (generalized)     Problem List Patient Active Problem List   Diagnosis Date Noted  . Encounter for screening colonoscopy 02/05/2019  . Hyperglyceridemia   . IBS (irritable bowel syndrome)   . Degenerative joint disease involving multiple joints     KGabriela Eves PT, DPT 06/14/2019,  11:20 AM  [facility] [address]Phone: [phone]  Fax:  [phone] Name: Cody MACMASTERMRN: [id number] of Birth: 910-20-1937

## 2019-06-28 DIAGNOSIS — M25551 Pain in right hip: Secondary | ICD-10-CM | POA: Diagnosis not present

## 2019-07-19 DIAGNOSIS — R972 Elevated prostate specific antigen [PSA]: Secondary | ICD-10-CM | POA: Diagnosis not present

## 2019-07-19 DIAGNOSIS — R3915 Urgency of urination: Secondary | ICD-10-CM | POA: Diagnosis not present

## 2019-07-19 DIAGNOSIS — N401 Enlarged prostate with lower urinary tract symptoms: Secondary | ICD-10-CM | POA: Diagnosis not present

## 2019-09-05 ENCOUNTER — Ambulatory Visit: Payer: PPO | Attending: Internal Medicine

## 2019-09-05 DIAGNOSIS — Z20822 Contact with and (suspected) exposure to covid-19: Secondary | ICD-10-CM

## 2019-09-07 ENCOUNTER — Ambulatory Visit: Payer: Self-pay

## 2019-09-07 LAB — NOVEL CORONAVIRUS, NAA: SARS-CoV-2, NAA: DETECTED — AB

## 2019-09-07 NOTE — Telephone Encounter (Signed)
See under covid results note.  Reason for Disposition . Health Information question, no triage required and triager able to answer question  Answer Assessment - Initial Assessment Questions 1. REASON FOR CALL or QUESTION: "What is your reason for calling today?" or "How can I best help you?" or "What question do you have that I can help answer?"     Results  Protocols used: INFORMATION ONLY CALL - NO TRIAGE-A-AH

## 2019-09-12 DIAGNOSIS — U071 COVID-19: Secondary | ICD-10-CM | POA: Diagnosis not present

## 2019-09-19 DIAGNOSIS — U071 COVID-19: Secondary | ICD-10-CM | POA: Diagnosis not present

## 2019-09-24 DIAGNOSIS — E7849 Other hyperlipidemia: Secondary | ICD-10-CM | POA: Diagnosis not present

## 2019-09-24 DIAGNOSIS — Z1389 Encounter for screening for other disorder: Secondary | ICD-10-CM | POA: Diagnosis not present

## 2019-09-24 DIAGNOSIS — K219 Gastro-esophageal reflux disease without esophagitis: Secondary | ICD-10-CM | POA: Diagnosis not present

## 2019-09-24 DIAGNOSIS — Z0001 Encounter for general adult medical examination with abnormal findings: Secondary | ICD-10-CM | POA: Diagnosis not present

## 2019-09-24 DIAGNOSIS — Z6824 Body mass index (BMI) 24.0-24.9, adult: Secondary | ICD-10-CM | POA: Diagnosis not present

## 2019-09-24 DIAGNOSIS — E119 Type 2 diabetes mellitus without complications: Secondary | ICD-10-CM | POA: Diagnosis not present

## 2019-10-03 DIAGNOSIS — H04123 Dry eye syndrome of bilateral lacrimal glands: Secondary | ICD-10-CM | POA: Diagnosis not present

## 2019-10-03 DIAGNOSIS — E119 Type 2 diabetes mellitus without complications: Secondary | ICD-10-CM | POA: Diagnosis not present

## 2019-10-03 DIAGNOSIS — H2513 Age-related nuclear cataract, bilateral: Secondary | ICD-10-CM | POA: Diagnosis not present

## 2019-11-27 DIAGNOSIS — E1165 Type 2 diabetes mellitus with hyperglycemia: Secondary | ICD-10-CM | POA: Diagnosis not present

## 2019-11-27 DIAGNOSIS — N401 Enlarged prostate with lower urinary tract symptoms: Secondary | ICD-10-CM | POA: Diagnosis not present

## 2019-11-27 DIAGNOSIS — E7849 Other hyperlipidemia: Secondary | ICD-10-CM | POA: Diagnosis not present

## 2020-01-27 DIAGNOSIS — N401 Enlarged prostate with lower urinary tract symptoms: Secondary | ICD-10-CM | POA: Diagnosis not present

## 2020-01-27 DIAGNOSIS — E7849 Other hyperlipidemia: Secondary | ICD-10-CM | POA: Diagnosis not present

## 2020-01-27 DIAGNOSIS — E1165 Type 2 diabetes mellitus with hyperglycemia: Secondary | ICD-10-CM | POA: Diagnosis not present

## 2020-03-27 DIAGNOSIS — N401 Enlarged prostate with lower urinary tract symptoms: Secondary | ICD-10-CM | POA: Diagnosis not present

## 2020-03-27 DIAGNOSIS — E1165 Type 2 diabetes mellitus with hyperglycemia: Secondary | ICD-10-CM | POA: Diagnosis not present

## 2020-03-27 DIAGNOSIS — E7849 Other hyperlipidemia: Secondary | ICD-10-CM | POA: Diagnosis not present

## 2020-04-28 DIAGNOSIS — E7849 Other hyperlipidemia: Secondary | ICD-10-CM | POA: Diagnosis not present

## 2020-04-28 DIAGNOSIS — E1165 Type 2 diabetes mellitus with hyperglycemia: Secondary | ICD-10-CM | POA: Diagnosis not present

## 2020-04-28 DIAGNOSIS — N401 Enlarged prostate with lower urinary tract symptoms: Secondary | ICD-10-CM | POA: Diagnosis not present

## 2020-04-30 ENCOUNTER — Ambulatory Visit: Payer: PPO | Admitting: Physician Assistant

## 2020-04-30 ENCOUNTER — Other Ambulatory Visit: Payer: Self-pay

## 2020-04-30 ENCOUNTER — Encounter: Payer: Self-pay | Admitting: Physician Assistant

## 2020-04-30 DIAGNOSIS — D0462 Carcinoma in situ of skin of left upper limb, including shoulder: Secondary | ICD-10-CM

## 2020-04-30 DIAGNOSIS — L570 Actinic keratosis: Secondary | ICD-10-CM

## 2020-04-30 DIAGNOSIS — C4492 Squamous cell carcinoma of skin, unspecified: Secondary | ICD-10-CM

## 2020-04-30 DIAGNOSIS — D485 Neoplasm of uncertain behavior of skin: Secondary | ICD-10-CM

## 2020-04-30 HISTORY — DX: Squamous cell carcinoma of skin, unspecified: C44.92

## 2020-04-30 NOTE — Patient Instructions (Signed)

## 2020-04-30 NOTE — Progress Notes (Signed)
   Follow up Visit  Subjective  Cody Johns is a 84 y.o. male who presents for the following: Skin Problem (Check spot on left arm has been scaley for a long time but this past weekend it got weepy and the scabed formed on the area. ). Started with a scale and then seemed to swell and get red around it. It also weeped. It itches around it but it is not painful.  Objective  Well appearing patient in no apparent distress; mood and affect are within normal limits.  All skin waist up examined. No suspicious moles noted on back.   Objective  Left Forearm - Posterior: Scabbed lesion     Objective  Left Ear, Left Temporal Scalp (2), Right Frontal Scalp, Right Postauricular Area, Right Preauricular Area: Erythematous patches with gritty scale.  Assessment & Plan  Neoplasm of uncertain behavior of skin Left Forearm - Posterior  Skin / nail biopsy Type of biopsy: tangential   Informed consent: discussed and consent obtained   Timeout: patient name, date of birth, surgical site, and procedure verified   Procedure prep:  Patient was prepped and draped in usual sterile fashion (Non sterile) Prep type:  Chlorhexidine Anesthesia: the lesion was anesthetized in a standard fashion   Anesthetic:  1% lidocaine w/ epinephrine 1-100,000 local infiltration Instrument used: flexible razor blade   Outcome: patient tolerated procedure well   Post-procedure details: wound care instructions given    Specimen 1 - Surgical pathology Differential Diagnosis: scc vs bcc Check Margins: No  AK (actinic keratosis) (6) Left Ear; Right Frontal Scalp; Right Preauricular Area; Right Postauricular Area; Left Temporal Scalp (2)  Destruction of lesion - Left Ear, Left Temporal Scalp, Right Frontal Scalp, Right Postauricular Area, Right Preauricular Area Complexity: simple   Destruction method: cryotherapy   Informed consent: discussed and consent obtained   Timeout:  patient name, date of birth,  surgical site, and procedure verified Lesion destroyed using liquid nitrogen: Yes   Outcome: patient tolerated procedure well with no complications

## 2020-05-06 ENCOUNTER — Telehealth: Payer: Self-pay

## 2020-05-06 NOTE — Telephone Encounter (Signed)
-----   Message from Arlyss Gandy, PA-C sent at 05/06/2020  8:00 AM EDT ----- 30 min with me

## 2020-05-06 NOTE — Telephone Encounter (Signed)
Pathology to patient November visit made

## 2020-06-27 DIAGNOSIS — N401 Enlarged prostate with lower urinary tract symptoms: Secondary | ICD-10-CM | POA: Diagnosis not present

## 2020-06-27 DIAGNOSIS — E7849 Other hyperlipidemia: Secondary | ICD-10-CM | POA: Diagnosis not present

## 2020-06-27 DIAGNOSIS — E1165 Type 2 diabetes mellitus with hyperglycemia: Secondary | ICD-10-CM | POA: Diagnosis not present

## 2020-07-16 ENCOUNTER — Encounter: Payer: PPO | Admitting: Physician Assistant

## 2020-07-17 DIAGNOSIS — R972 Elevated prostate specific antigen [PSA]: Secondary | ICD-10-CM | POA: Diagnosis not present

## 2020-07-17 DIAGNOSIS — R3915 Urgency of urination: Secondary | ICD-10-CM | POA: Diagnosis not present

## 2020-07-17 DIAGNOSIS — N401 Enlarged prostate with lower urinary tract symptoms: Secondary | ICD-10-CM | POA: Diagnosis not present

## 2020-07-28 DIAGNOSIS — E1165 Type 2 diabetes mellitus with hyperglycemia: Secondary | ICD-10-CM | POA: Diagnosis not present

## 2020-07-28 DIAGNOSIS — N401 Enlarged prostate with lower urinary tract symptoms: Secondary | ICD-10-CM | POA: Diagnosis not present

## 2020-07-28 DIAGNOSIS — E7849 Other hyperlipidemia: Secondary | ICD-10-CM | POA: Diagnosis not present

## 2020-07-30 ENCOUNTER — Encounter: Payer: Self-pay | Admitting: Dermatology

## 2020-07-30 ENCOUNTER — Other Ambulatory Visit: Payer: Self-pay

## 2020-07-30 ENCOUNTER — Ambulatory Visit (INDEPENDENT_AMBULATORY_CARE_PROVIDER_SITE_OTHER): Payer: PPO | Admitting: Dermatology

## 2020-07-30 DIAGNOSIS — L570 Actinic keratosis: Secondary | ICD-10-CM

## 2020-07-30 DIAGNOSIS — D049 Carcinoma in situ of skin, unspecified: Secondary | ICD-10-CM

## 2020-07-30 DIAGNOSIS — D0462 Carcinoma in situ of skin of left upper limb, including shoulder: Secondary | ICD-10-CM | POA: Diagnosis not present

## 2020-07-30 DIAGNOSIS — L821 Other seborrheic keratosis: Secondary | ICD-10-CM | POA: Diagnosis not present

## 2020-07-30 NOTE — Patient Instructions (Signed)

## 2020-08-05 ENCOUNTER — Encounter: Payer: Self-pay | Admitting: Dermatology

## 2020-08-05 NOTE — Progress Notes (Signed)
   Follow-Up Visit   Subjective  Cody Johns is a 84 y.o. male who presents for the following: Procedure (Treatment CIS x 1 left forerarm.  Also check one spot on the top of patients scalp x years and one spot on the left side of scalp x unsure.).  CIS Location: Left forearm Duration:  Quality:  Associated Signs/Symptoms: Modifying Factors:  Severity:  Timing: Context: For treatment  Objective  Well appearing patient in no apparent distress; mood and affect are within normal limits.  A focused examination was performed including head, neck, arms.   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  Destruction of lesion Complexity: simple   Destruction method: electrodesiccation and curettage   Informed consent: discussed and consent obtained   Timeout:  patient name, date of birth, surgical site, and procedure verified Anesthesia: the lesion was anesthetized in a standard fashion   Anesthetic:  1% lidocaine w/ epinephrine 1-100,000 local infiltration Curettage performed in three different directions: Yes   Curettage cycles:  3 Lesion length (cm):  3.1 Lesion width (cm):  3.1 Margin per side (cm):  0 Final wound size (cm):  3.1 Hemostasis achieved with:  ferric subsulfate Outcome: patient tolerated procedure well with no complications   Additional details:  Wound innoculated with 5 fluorouracil solution.  AK (actinic keratosis) Mid Parietal Scalp  If still present will freeze when he returns for skin cancer follow-up  Seborrheic keratosis Left Parietal Scalp  Leave if stable.     I, Lavonna Monarch, MD, have reviewed all documentation for this visit.  The documentation on 08/05/20 for the exam, diagnosis, procedures, and orders are all accurate and complete.

## 2020-10-05 DIAGNOSIS — H02834 Dermatochalasis of left upper eyelid: Secondary | ICD-10-CM | POA: Diagnosis not present

## 2020-10-05 DIAGNOSIS — H02831 Dermatochalasis of right upper eyelid: Secondary | ICD-10-CM | POA: Diagnosis not present

## 2020-10-05 DIAGNOSIS — E119 Type 2 diabetes mellitus without complications: Secondary | ICD-10-CM | POA: Diagnosis not present

## 2020-10-05 DIAGNOSIS — H04123 Dry eye syndrome of bilateral lacrimal glands: Secondary | ICD-10-CM | POA: Diagnosis not present

## 2020-10-05 DIAGNOSIS — H2513 Age-related nuclear cataract, bilateral: Secondary | ICD-10-CM | POA: Diagnosis not present

## 2020-10-28 ENCOUNTER — Encounter: Payer: Self-pay | Admitting: Dermatology

## 2020-10-28 ENCOUNTER — Other Ambulatory Visit: Payer: Self-pay

## 2020-10-28 ENCOUNTER — Ambulatory Visit: Payer: PPO | Admitting: Dermatology

## 2020-10-28 DIAGNOSIS — Z1283 Encounter for screening for malignant neoplasm of skin: Secondary | ICD-10-CM | POA: Diagnosis not present

## 2020-10-28 DIAGNOSIS — Z85828 Personal history of other malignant neoplasm of skin: Secondary | ICD-10-CM | POA: Diagnosis not present

## 2020-10-28 DIAGNOSIS — L821 Other seborrheic keratosis: Secondary | ICD-10-CM

## 2020-10-28 DIAGNOSIS — L570 Actinic keratosis: Secondary | ICD-10-CM | POA: Diagnosis not present

## 2020-10-28 DIAGNOSIS — Z8589 Personal history of malignant neoplasm of other organs and systems: Secondary | ICD-10-CM

## 2020-11-08 ENCOUNTER — Encounter: Payer: Self-pay | Admitting: Dermatology

## 2020-11-08 NOTE — Progress Notes (Signed)
   Follow-Up Visit   Subjective  Cody Johns is a 85 y.o. male who presents for the following: Follow-up (F/u left foearm posterior- healing good no concerns).  Follow-up skin cancer left arm this several other spots to check Location:  Duration:  Quality:  Associated Signs/Symptoms: Modifying Factors:  Severity:  Timing: Context:   Objective  Well appearing patient in no apparent distress; mood and affect are within normal limits. Objective  Head - Anterior (Face): Nose & right forehead: Pink gritty flat crusts  Objective  Mid Parietal Scalp: Vertex of scalp 6 mm flattopped textured papule   Objective  Right Forearm - Posterior: Site clear on arm  Objective  Neck - Posterior: Waist up skin examination no new nonmelanoma skin cancers or atypical pigmented lesions   All skin waist up examined.   Assessment & Plan    AK (actinic keratosis) Head - Anterior (Face)  Destruction of lesion - Head - Anterior (Face) Complexity: simple   Destruction method: cryotherapy   Informed consent: discussed and consent obtained   Lesion destroyed using liquid nitrogen: Yes   Cryotherapy cycles:  5 Outcome: patient tolerated procedure well with no complications    Seborrheic keratosis Mid Parietal Scalp  Leave if stable  History of squamous cell carcinoma Right Forearm - Posterior  Recheck as needed  Skin exam for malignant neoplasm Neck - Posterior  Annual skin examination     I, Lavonna Monarch, MD, have reviewed all documentation for this visit.  The documentation on 11/08/20 for the exam, diagnosis, procedures, and orders are all accurate and complete.

## 2020-11-25 DIAGNOSIS — E1165 Type 2 diabetes mellitus with hyperglycemia: Secondary | ICD-10-CM | POA: Diagnosis not present

## 2020-11-25 DIAGNOSIS — E7849 Other hyperlipidemia: Secondary | ICD-10-CM | POA: Diagnosis not present

## 2020-12-01 DIAGNOSIS — E119 Type 2 diabetes mellitus without complications: Secondary | ICD-10-CM | POA: Diagnosis not present

## 2020-12-01 DIAGNOSIS — E1140 Type 2 diabetes mellitus with diabetic neuropathy, unspecified: Secondary | ICD-10-CM | POA: Diagnosis not present

## 2020-12-01 DIAGNOSIS — K219 Gastro-esophageal reflux disease without esophagitis: Secondary | ICD-10-CM | POA: Diagnosis not present

## 2020-12-01 DIAGNOSIS — B351 Tinea unguium: Secondary | ICD-10-CM | POA: Diagnosis not present

## 2020-12-01 DIAGNOSIS — Z6836 Body mass index (BMI) 36.0-36.9, adult: Secondary | ICD-10-CM | POA: Diagnosis not present

## 2020-12-01 DIAGNOSIS — E6609 Other obesity due to excess calories: Secondary | ICD-10-CM | POA: Diagnosis not present

## 2020-12-01 DIAGNOSIS — U071 COVID-19: Secondary | ICD-10-CM | POA: Diagnosis not present

## 2020-12-01 DIAGNOSIS — Z1389 Encounter for screening for other disorder: Secondary | ICD-10-CM | POA: Diagnosis not present

## 2020-12-01 DIAGNOSIS — Z1331 Encounter for screening for depression: Secondary | ICD-10-CM | POA: Diagnosis not present

## 2020-12-01 DIAGNOSIS — N401 Enlarged prostate with lower urinary tract symptoms: Secondary | ICD-10-CM | POA: Diagnosis not present

## 2020-12-01 DIAGNOSIS — R201 Hypoesthesia of skin: Secondary | ICD-10-CM | POA: Diagnosis not present

## 2020-12-01 DIAGNOSIS — Z7689 Persons encountering health services in other specified circumstances: Secondary | ICD-10-CM | POA: Diagnosis not present

## 2020-12-01 DIAGNOSIS — Z0001 Encounter for general adult medical examination with abnormal findings: Secondary | ICD-10-CM | POA: Diagnosis not present

## 2020-12-01 DIAGNOSIS — E7849 Other hyperlipidemia: Secondary | ICD-10-CM | POA: Diagnosis not present

## 2020-12-01 DIAGNOSIS — K589 Irritable bowel syndrome without diarrhea: Secondary | ICD-10-CM | POA: Diagnosis not present

## 2020-12-01 DIAGNOSIS — Z0000 Encounter for general adult medical examination without abnormal findings: Secondary | ICD-10-CM | POA: Diagnosis not present

## 2020-12-26 DIAGNOSIS — E7849 Other hyperlipidemia: Secondary | ICD-10-CM | POA: Diagnosis not present

## 2020-12-26 DIAGNOSIS — E1165 Type 2 diabetes mellitus with hyperglycemia: Secondary | ICD-10-CM | POA: Diagnosis not present

## 2021-02-12 DIAGNOSIS — N400 Enlarged prostate without lower urinary tract symptoms: Secondary | ICD-10-CM | POA: Diagnosis not present

## 2021-02-12 DIAGNOSIS — K219 Gastro-esophageal reflux disease without esophagitis: Secondary | ICD-10-CM | POA: Diagnosis not present

## 2021-02-12 DIAGNOSIS — Z79899 Other long term (current) drug therapy: Secondary | ICD-10-CM | POA: Diagnosis not present

## 2021-02-12 DIAGNOSIS — G43109 Migraine with aura, not intractable, without status migrainosus: Secondary | ICD-10-CM | POA: Diagnosis not present

## 2021-02-12 DIAGNOSIS — M1991 Primary osteoarthritis, unspecified site: Secondary | ICD-10-CM | POA: Diagnosis not present

## 2021-02-12 DIAGNOSIS — R42 Dizziness and giddiness: Secondary | ICD-10-CM | POA: Diagnosis not present

## 2021-02-12 DIAGNOSIS — Z6825 Body mass index (BMI) 25.0-25.9, adult: Secondary | ICD-10-CM | POA: Diagnosis not present

## 2021-02-12 DIAGNOSIS — E663 Overweight: Secondary | ICD-10-CM | POA: Diagnosis not present

## 2021-02-12 DIAGNOSIS — E1165 Type 2 diabetes mellitus with hyperglycemia: Secondary | ICD-10-CM | POA: Diagnosis not present

## 2021-02-22 ENCOUNTER — Other Ambulatory Visit (HOSPITAL_COMMUNITY): Payer: Self-pay | Admitting: Internal Medicine

## 2021-02-22 DIAGNOSIS — H539 Unspecified visual disturbance: Secondary | ICD-10-CM

## 2021-02-22 DIAGNOSIS — G459 Transient cerebral ischemic attack, unspecified: Secondary | ICD-10-CM

## 2021-02-25 DIAGNOSIS — E7849 Other hyperlipidemia: Secondary | ICD-10-CM | POA: Diagnosis not present

## 2021-02-25 DIAGNOSIS — E1165 Type 2 diabetes mellitus with hyperglycemia: Secondary | ICD-10-CM | POA: Diagnosis not present

## 2021-02-26 ENCOUNTER — Other Ambulatory Visit: Payer: Self-pay

## 2021-02-26 ENCOUNTER — Ambulatory Visit (HOSPITAL_COMMUNITY)
Admission: RE | Admit: 2021-02-26 | Discharge: 2021-02-26 | Disposition: A | Discharge status: Home / Self Care | Payer: PPO | Source: Ambulatory Visit | Attending: Internal Medicine | Admitting: Internal Medicine

## 2021-02-26 DIAGNOSIS — G459 Transient cerebral ischemic attack, unspecified: Secondary | ICD-10-CM | POA: Insufficient documentation

## 2021-02-26 DIAGNOSIS — H539 Unspecified visual disturbance: Secondary | ICD-10-CM | POA: Diagnosis not present

## 2021-02-26 DIAGNOSIS — I6523 Occlusion and stenosis of bilateral carotid arteries: Secondary | ICD-10-CM | POA: Diagnosis not present

## 2021-06-17 DIAGNOSIS — L309 Dermatitis, unspecified: Secondary | ICD-10-CM | POA: Diagnosis not present

## 2021-06-17 DIAGNOSIS — H6123 Impacted cerumen, bilateral: Secondary | ICD-10-CM | POA: Diagnosis not present

## 2021-06-17 DIAGNOSIS — Z6825 Body mass index (BMI) 25.0-25.9, adult: Secondary | ICD-10-CM | POA: Diagnosis not present

## 2021-06-17 DIAGNOSIS — E663 Overweight: Secondary | ICD-10-CM | POA: Diagnosis not present

## 2021-07-08 DIAGNOSIS — Z6825 Body mass index (BMI) 25.0-25.9, adult: Secondary | ICD-10-CM | POA: Diagnosis not present

## 2021-07-08 DIAGNOSIS — E782 Mixed hyperlipidemia: Secondary | ICD-10-CM | POA: Diagnosis not present

## 2021-07-08 DIAGNOSIS — E1140 Type 2 diabetes mellitus with diabetic neuropathy, unspecified: Secondary | ICD-10-CM | POA: Diagnosis not present

## 2021-07-08 DIAGNOSIS — M1991 Primary osteoarthritis, unspecified site: Secondary | ICD-10-CM | POA: Diagnosis not present

## 2021-07-08 DIAGNOSIS — E663 Overweight: Secondary | ICD-10-CM | POA: Diagnosis not present

## 2021-07-15 DIAGNOSIS — D692 Other nonthrombocytopenic purpura: Secondary | ICD-10-CM | POA: Diagnosis not present

## 2021-07-15 DIAGNOSIS — L309 Dermatitis, unspecified: Secondary | ICD-10-CM | POA: Diagnosis not present

## 2021-07-15 DIAGNOSIS — Z6825 Body mass index (BMI) 25.0-25.9, adult: Secondary | ICD-10-CM | POA: Diagnosis not present

## 2021-07-15 DIAGNOSIS — E663 Overweight: Secondary | ICD-10-CM | POA: Diagnosis not present

## 2021-07-16 DIAGNOSIS — R3915 Urgency of urination: Secondary | ICD-10-CM | POA: Diagnosis not present

## 2021-07-16 DIAGNOSIS — R972 Elevated prostate specific antigen [PSA]: Secondary | ICD-10-CM | POA: Diagnosis not present

## 2021-07-16 DIAGNOSIS — N401 Enlarged prostate with lower urinary tract symptoms: Secondary | ICD-10-CM | POA: Diagnosis not present

## 2021-07-21 DIAGNOSIS — H61899 Other specified disorders of external ear, unspecified ear: Secondary | ICD-10-CM | POA: Diagnosis not present

## 2021-07-21 DIAGNOSIS — L508 Other urticaria: Secondary | ICD-10-CM | POA: Diagnosis not present

## 2021-09-07 IMAGING — US US CAROTID DUPLEX BILAT
1 series · 14 of 24 positions shown · non-contrast
Comparison: CT neck with contrast 05/16/2019

CLINICAL DATA: TIA

Visual disturbance
EXAM:
BILATERAL CAROTID DUPLEX ULTRASOUND
TECHNIQUE: Gray scale imaging, color Doppler and duplex ultrasound were
performed of bilateral carotid and vertebral arteries in the neck.

[Series 1: us carotid bilateral · 14 of 68 slices shown]
[im 1/68]
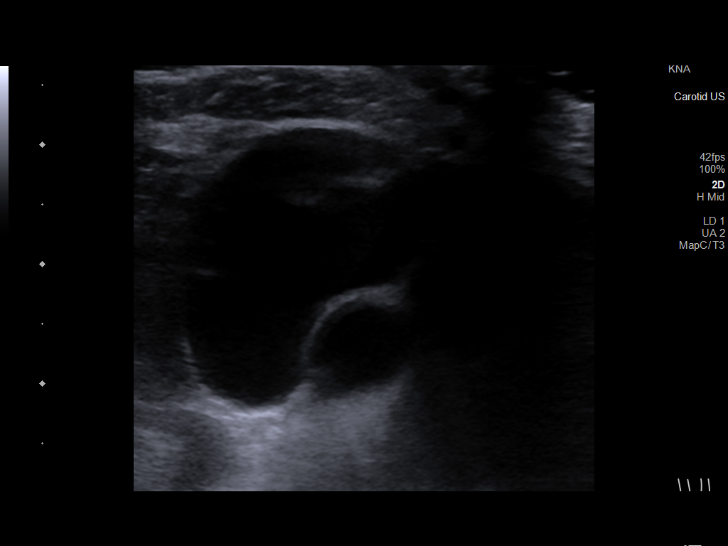
[im 6/68]
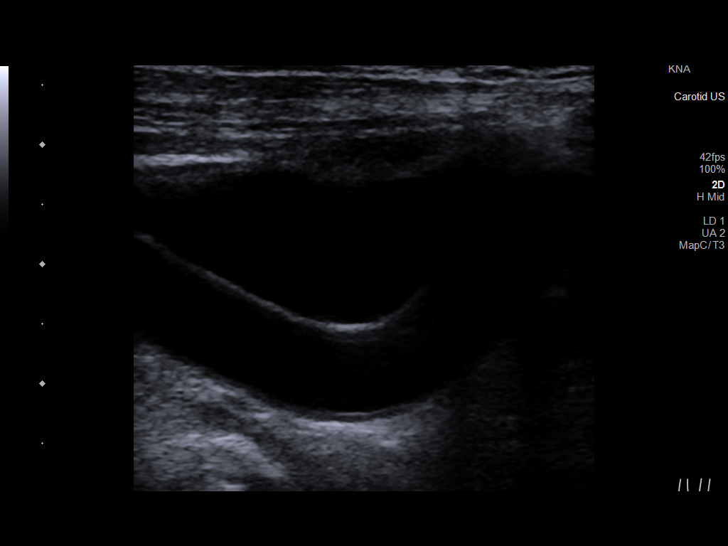
[im 12/68]
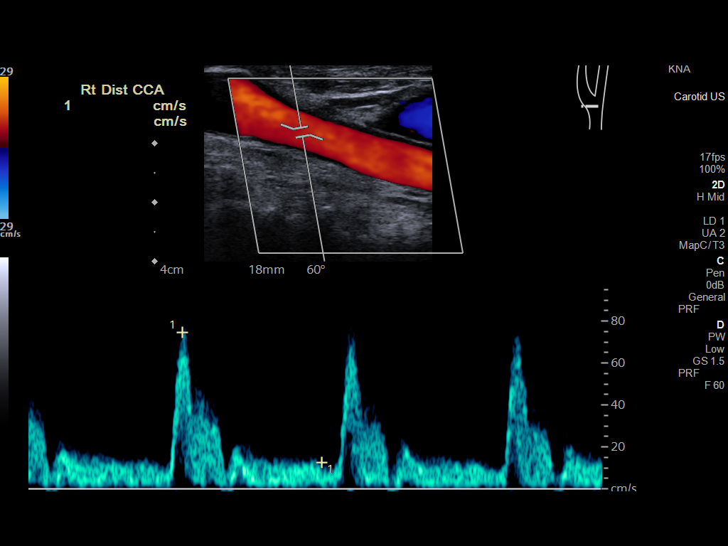
[im 18/68]
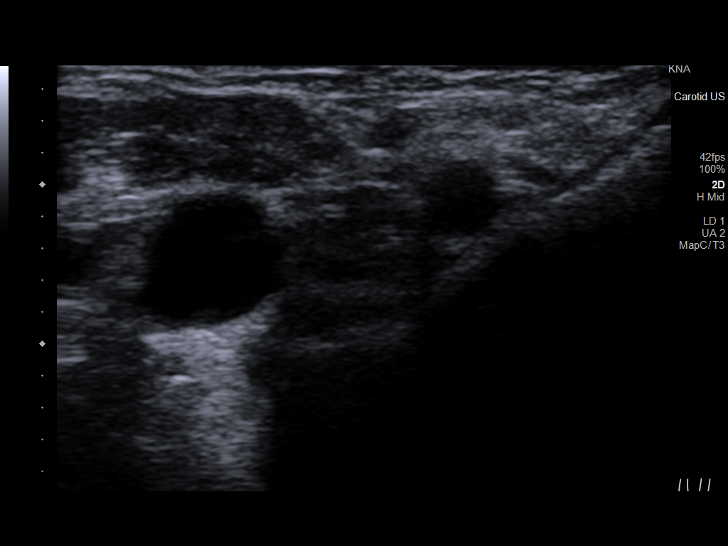
[im 21/68]
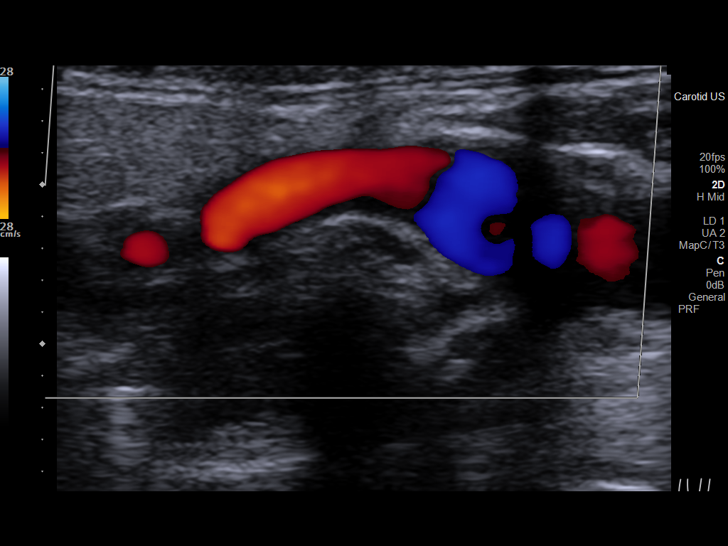
[im 27/68]
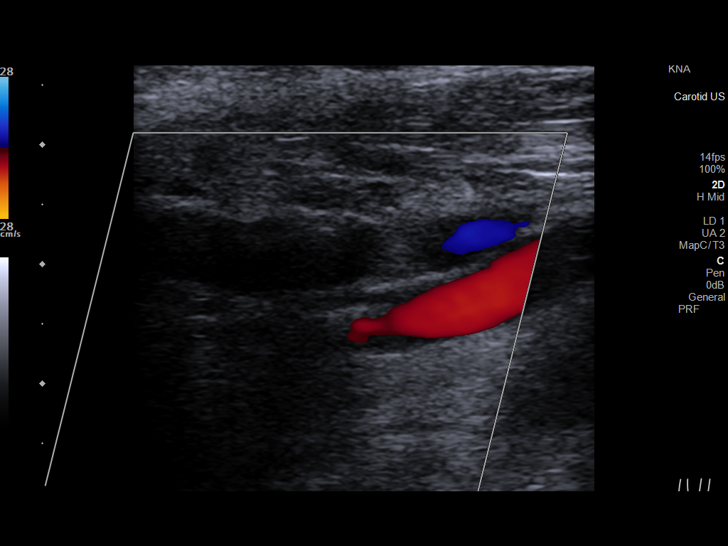
[im 33/68]
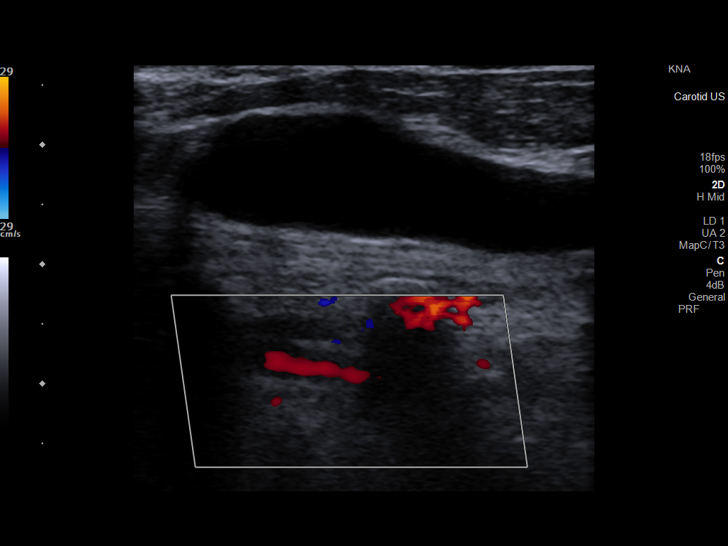
[im 35/68]
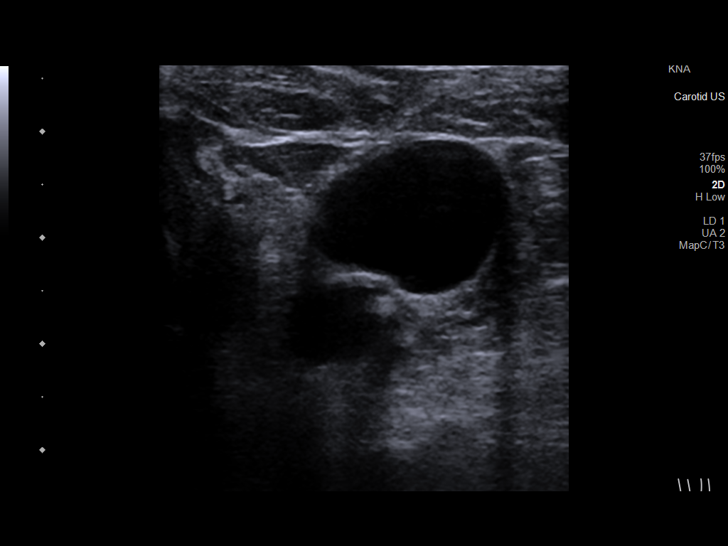
[im 41/68]
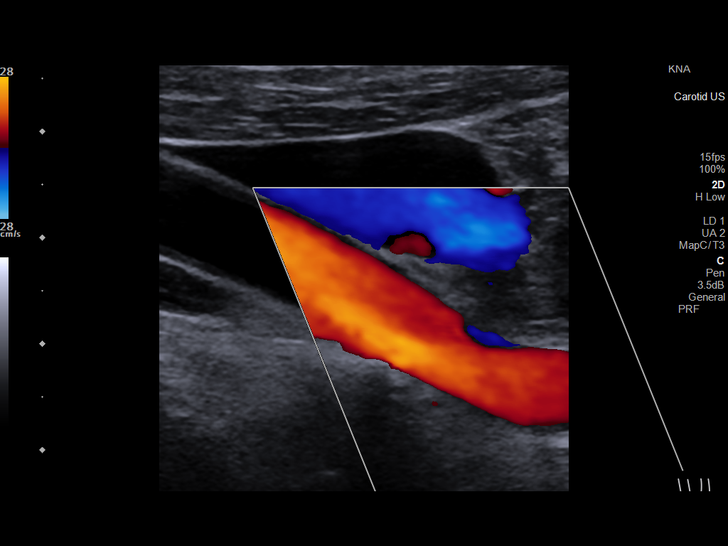
[im 47/68]
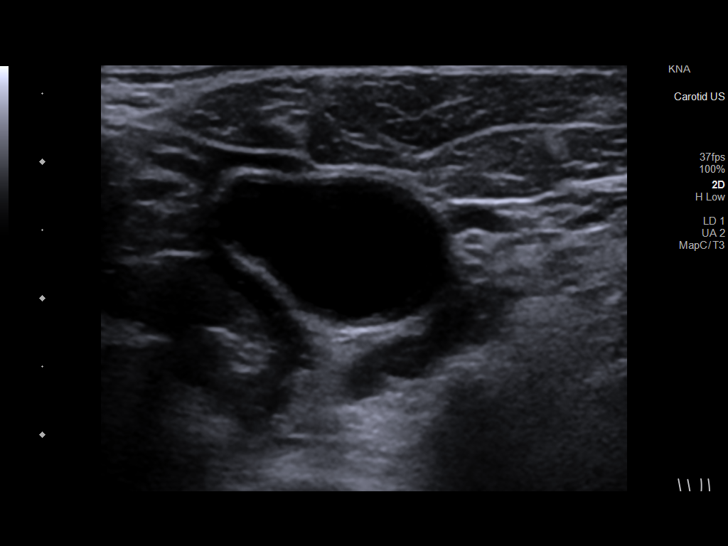
[im 53/68]
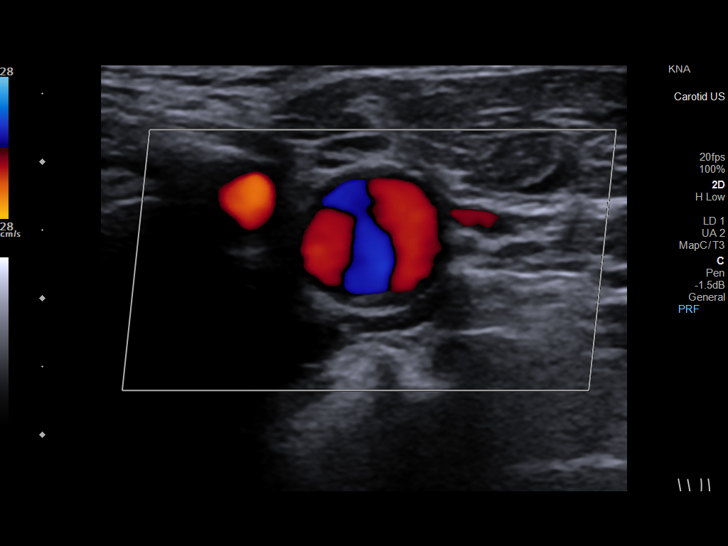
[im 56/68]
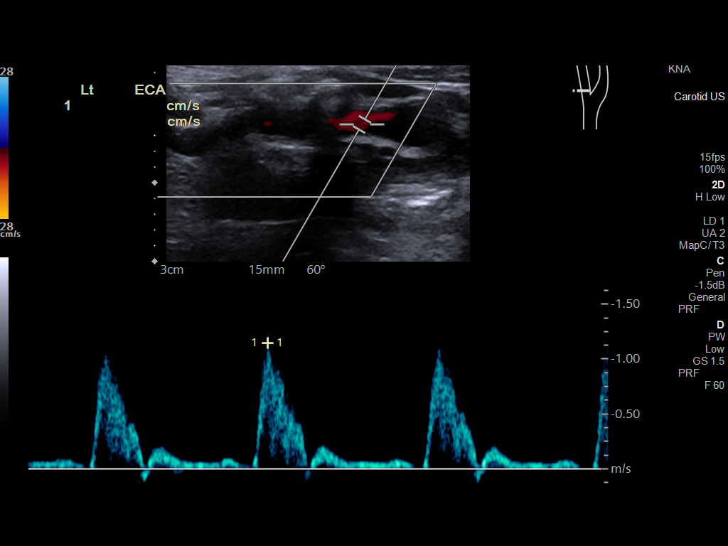
[im 62/68]
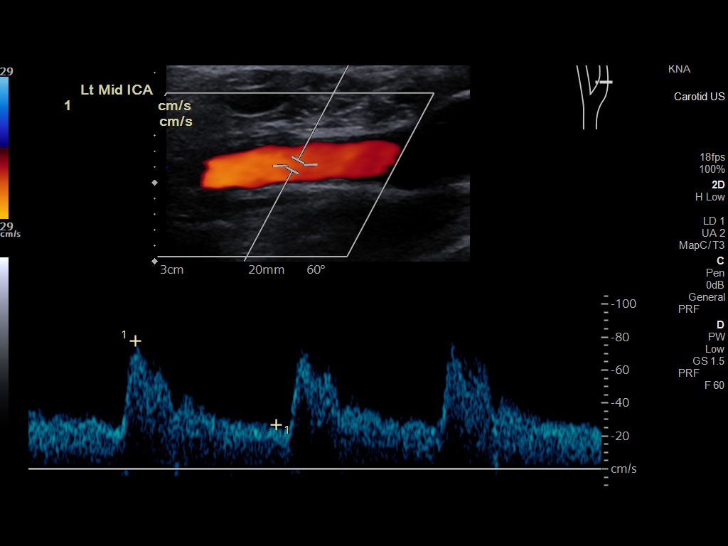
[im 68/68]
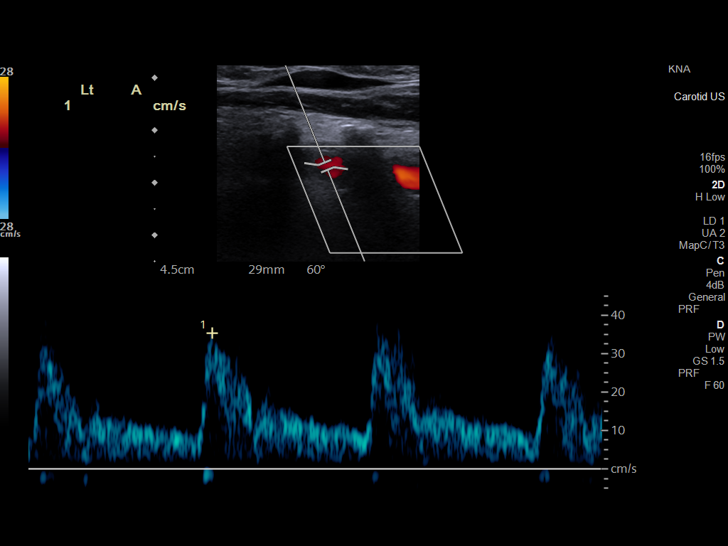

[14 of 24 positions shown; findings below may reference images not displayed]

FINDINGS: Criteria: Quantification of carotid stenosis is based on velocity
parameters that correlate the residual internal carotid diameter
with NASCET-based stenosis levels, using the diameter of the distal
internal carotid lumen as the denominator for stenosis measurement.

The following velocity measurements were obtained:

RIGHT

ICA: 85/26 cm/sec

CCA: 109/13 cm/sec

SYSTOLIC ICA/CCA RATIO:

ECA: 135 cm/sec

LEFT

ICA: 91/27 cm/sec

CCA: 11/16 cm/sec

SYSTOLIC ICA/CCA RATIO:

ECA: 115 cm/sec

RIGHT CAROTID ARTERY: No significant atheromatous plaque.

RIGHT VERTEBRAL ARTERY:  Antegrade flow.

LEFT CAROTID ARTERY:  No significant atheromatous plaque.

LEFT VERTEBRAL ARTERY:  Antegrade flow.
IMPRESSION: No significant stenosis of internal carotid arteries.

## 2021-10-06 DIAGNOSIS — E119 Type 2 diabetes mellitus without complications: Secondary | ICD-10-CM | POA: Diagnosis not present

## 2021-10-06 DIAGNOSIS — H02831 Dermatochalasis of right upper eyelid: Secondary | ICD-10-CM | POA: Diagnosis not present

## 2021-10-06 DIAGNOSIS — H25812 Combined forms of age-related cataract, left eye: Secondary | ICD-10-CM | POA: Diagnosis not present

## 2021-10-06 DIAGNOSIS — H2511 Age-related nuclear cataract, right eye: Secondary | ICD-10-CM | POA: Diagnosis not present

## 2021-10-06 DIAGNOSIS — H04123 Dry eye syndrome of bilateral lacrimal glands: Secondary | ICD-10-CM | POA: Diagnosis not present

## 2021-10-06 DIAGNOSIS — H02834 Dermatochalasis of left upper eyelid: Secondary | ICD-10-CM | POA: Diagnosis not present

## 2021-10-13 DIAGNOSIS — R972 Elevated prostate specific antigen [PSA]: Secondary | ICD-10-CM | POA: Diagnosis not present

## 2021-10-13 DIAGNOSIS — N401 Enlarged prostate with lower urinary tract symptoms: Secondary | ICD-10-CM | POA: Diagnosis not present

## 2021-10-13 DIAGNOSIS — R3915 Urgency of urination: Secondary | ICD-10-CM | POA: Diagnosis not present

## 2021-11-02 ENCOUNTER — Other Ambulatory Visit: Payer: Self-pay

## 2021-11-02 ENCOUNTER — Encounter: Payer: Self-pay | Admitting: Dermatology

## 2021-11-02 ENCOUNTER — Ambulatory Visit: Payer: PPO | Admitting: Dermatology

## 2021-11-02 DIAGNOSIS — Z1283 Encounter for screening for malignant neoplasm of skin: Secondary | ICD-10-CM

## 2021-11-02 DIAGNOSIS — R238 Other skin changes: Secondary | ICD-10-CM

## 2021-11-02 DIAGNOSIS — L821 Other seborrheic keratosis: Secondary | ICD-10-CM | POA: Diagnosis not present

## 2021-11-02 DIAGNOSIS — L570 Actinic keratosis: Secondary | ICD-10-CM | POA: Diagnosis not present

## 2021-11-02 DIAGNOSIS — D0439 Carcinoma in situ of skin of other parts of face: Secondary | ICD-10-CM | POA: Diagnosis not present

## 2021-11-02 DIAGNOSIS — D1801 Hemangioma of skin and subcutaneous tissue: Secondary | ICD-10-CM | POA: Diagnosis not present

## 2021-11-02 DIAGNOSIS — D044 Carcinoma in situ of skin of scalp and neck: Secondary | ICD-10-CM | POA: Diagnosis not present

## 2021-11-02 DIAGNOSIS — D485 Neoplasm of uncertain behavior of skin: Secondary | ICD-10-CM

## 2021-11-02 NOTE — Patient Instructions (Addendum)
2 GENERIC CLARITIN (LORATADINE) DAILY FOR THE HIVES. IF NO IMPROVEMENT WITHIN 10 DAYS PLEASE CALL THE OFFICE  ? ? ?Biopsy, Surgery (Curettage) & Surgery (Excision) Aftercare Instructions ? ?1. Okay to remove bandage in 24 hours ? ?2. Wash area with soap and water ? ?3. Apply Vaseline to area twice daily until healed (Not Neosporin) ? ?4. Okay to cover with a Band-Aid to decrease the chance of infection or prevent irritation from clothing; also it's okay to uncover lesion at home. ? ?5. Suture instructions: return to our office in 7-10 or 10-14 days for a nurse visit for suture removal. Variable healing with sutures, if pain or itching occurs call our office. It's okay to shower or bathe 24 hours after sutures are given. ? ?6. The following risks may occur after a biopsy, curettage or excision: bleeding, scarring, discoloration, recurrence, infection (redness, yellow drainage, pain or swelling). ? ?7. For questions, concerns and results call our office at [facility] before 4pm & Friday before 3pm. Biopsy results will be available in 1 week. ? ?

## 2021-11-12 ENCOUNTER — Encounter: Payer: Self-pay | Admitting: Dermatology

## 2021-11-12 NOTE — Progress Notes (Signed)
? ?  Follow-Up Visit ?  ?Subjective  ?Cody Johns is a 86 y.o. male who presents for the following: Annual Exam (No new concerns). ? ?Annual skin examination, growing crust forehead, crust on arms ?Location:  ?Duration:  ?Quality:  ?Associated Signs/Symptoms: ?Modifying Factors:  ?Severity:  ?Timing: ?Context:  ? ?Objective  ?Well appearing patient in no apparent distress; mood and affect are within normal limits. ?Abdomen (Lower Torso, Anterior), Generalized ?Multiple 2 mm smooth red dermal papules ? ?Mid Back ?Scattered 4 to 7 mm brown flattopped textured papules, typical dermoscopy ? ?Left Ear ?Soft 3 mm blue partially compressible nodule ? ?Left Forearm - Anterior, Nose, Right Forearm - Anterior, Scalp ?Gritty 5 mm pink crusts ? ?Left Frontal Scalp ? ?Left Frontal Scalp ?6 mm waxy pink crust, probable SCCA ? ? ? ? ? ? ? ? ?All skin waist up examined. ? ? ?Assessment & Plan  ? ? ?Seborrheic keratosis ?Mid Back ? ?Leave if stable ? ?Venous [place] ?Left Ear ? ?No intervention necessary ? ?Cherry angioma (2) ?Abdomen (Lower Torso, Anterior); Generalized ? ?No intervention indicated ? ?Actinic keratosis (4) ?Nose; Left Forearm - Anterior; Right Forearm - Anterior; Scalp ? ?Destruction of lesion - Left Forearm - Anterior, Nose, Right Forearm - Anterior, Scalp ?Complexity: simple   ?Destruction method: cryotherapy   ?Informed consent: discussed and consent obtained   ?Lesion destroyed using liquid nitrogen: Yes   ?Cryotherapy cycles:  5 ?Outcome: patient tolerated procedure well with no complications   ? ?Carcinoma in situ of skin of forehead ?Left Frontal Scalp ? ?After shave biopsy the base was treated with curettage plus cautery ? ?Skin / nail biopsy - Left Frontal Scalp ?Type of biopsy: tangential   ?Informed consent: discussed and consent obtained   ?Timeout: patient name, date of birth, surgical site, and procedure verified   ?Anesthesia: the lesion was anesthetized in a standard fashion   ?Anesthetic:  1%  lidocaine w/ epinephrine 1-100,000 local infiltration ?Instrument used: flexible razor blade   ?Hemostasis achieved with: ferric subsulfate and electrodesiccation   ?Outcome: patient tolerated procedure well   ?Post-procedure details: wound care instructions given   ? ?Destruction of lesion - Left Frontal Scalp ?Complexity: simple   ?Destruction method: electrodesiccation and curettage   ?Informed consent: discussed and consent obtained   ?Timeout:  patient name, date of birth, surgical site, and procedure verified ?Anesthesia: the lesion was anesthetized in a standard fashion   ?Anesthetic:  1% lidocaine w/ epinephrine 1-100,000 local infiltration ?Curettage performed in three different directions: Yes   ?Curettage cycles:  3 ?Lesion length (cm):  0.6 ?Lesion width (cm):  0.6 ?Margin per side (cm):  0 ?Final wound size (cm):  0.6 ?Hemostasis achieved with:  ferric subsulfate ?Outcome: patient tolerated procedure well with no complications   ?Post-procedure details: wound care instructions given   ? ?Specimen 1 - Surgical pathology ?Differential Diagnosis: R/O BCC VS SCC - TXPBX ? ?Check Margins: No ? ? ? ? ? ?I, Lavonna Monarch, MD, have reviewed all documentation for this visit.  The documentation on 11/12/21 for the exam, diagnosis, procedures, and orders are all accurate and complete. ?

## 2022-02-02 DIAGNOSIS — Z125 Encounter for screening for malignant neoplasm of prostate: Secondary | ICD-10-CM | POA: Diagnosis not present

## 2022-02-02 DIAGNOSIS — Z0001 Encounter for general adult medical examination with abnormal findings: Secondary | ICD-10-CM | POA: Diagnosis not present

## 2022-02-02 DIAGNOSIS — Z1331 Encounter for screening for depression: Secondary | ICD-10-CM | POA: Diagnosis not present

## 2022-02-02 DIAGNOSIS — R972 Elevated prostate specific antigen [PSA]: Secondary | ICD-10-CM | POA: Diagnosis not present

## 2022-02-02 DIAGNOSIS — N400 Enlarged prostate without lower urinary tract symptoms: Secondary | ICD-10-CM | POA: Diagnosis not present

## 2022-02-02 DIAGNOSIS — E1140 Type 2 diabetes mellitus with diabetic neuropathy, unspecified: Secondary | ICD-10-CM | POA: Diagnosis not present

## 2022-02-02 DIAGNOSIS — E782 Mixed hyperlipidemia: Secondary | ICD-10-CM | POA: Diagnosis not present

## 2022-02-02 DIAGNOSIS — E663 Overweight: Secondary | ICD-10-CM | POA: Diagnosis not present

## 2022-02-02 DIAGNOSIS — Z6825 Body mass index (BMI) 25.0-25.9, adult: Secondary | ICD-10-CM | POA: Diagnosis not present

## 2022-05-24 DIAGNOSIS — D696 Thrombocytopenia, unspecified: Secondary | ICD-10-CM | POA: Diagnosis not present

## 2022-05-24 DIAGNOSIS — M159 Polyosteoarthritis, unspecified: Secondary | ICD-10-CM | POA: Diagnosis not present

## 2022-05-24 DIAGNOSIS — E663 Overweight: Secondary | ICD-10-CM | POA: Diagnosis not present

## 2022-05-24 DIAGNOSIS — E119 Type 2 diabetes mellitus without complications: Secondary | ICD-10-CM | POA: Diagnosis not present

## 2022-05-24 DIAGNOSIS — Z6825 Body mass index (BMI) 25.0-25.9, adult: Secondary | ICD-10-CM | POA: Diagnosis not present

## 2022-10-12 DIAGNOSIS — H25812 Combined forms of age-related cataract, left eye: Secondary | ICD-10-CM | POA: Diagnosis not present

## 2022-10-12 DIAGNOSIS — H2511 Age-related nuclear cataract, right eye: Secondary | ICD-10-CM | POA: Diagnosis not present

## 2022-10-12 DIAGNOSIS — H02834 Dermatochalasis of left upper eyelid: Secondary | ICD-10-CM | POA: Diagnosis not present

## 2022-10-12 DIAGNOSIS — H04123 Dry eye syndrome of bilateral lacrimal glands: Secondary | ICD-10-CM | POA: Diagnosis not present

## 2022-10-12 DIAGNOSIS — H02831 Dermatochalasis of right upper eyelid: Secondary | ICD-10-CM | POA: Diagnosis not present

## 2022-10-12 DIAGNOSIS — E119 Type 2 diabetes mellitus without complications: Secondary | ICD-10-CM | POA: Diagnosis not present

## 2022-10-12 DIAGNOSIS — H43813 Vitreous degeneration, bilateral: Secondary | ICD-10-CM | POA: Diagnosis not present

## 2022-10-17 DIAGNOSIS — R3915 Urgency of urination: Secondary | ICD-10-CM | POA: Diagnosis not present

## 2022-10-17 DIAGNOSIS — N401 Enlarged prostate with lower urinary tract symptoms: Secondary | ICD-10-CM | POA: Diagnosis not present

## 2022-11-07 ENCOUNTER — Ambulatory Visit: Payer: PPO | Admitting: Dermatology

## 2022-11-08 DIAGNOSIS — H2511 Age-related nuclear cataract, right eye: Secondary | ICD-10-CM | POA: Diagnosis not present

## 2022-12-02 DIAGNOSIS — E1140 Type 2 diabetes mellitus with diabetic neuropathy, unspecified: Secondary | ICD-10-CM | POA: Diagnosis not present

## 2022-12-02 DIAGNOSIS — E1165 Type 2 diabetes mellitus with hyperglycemia: Secondary | ICD-10-CM | POA: Diagnosis not present

## 2022-12-02 DIAGNOSIS — Z1331 Encounter for screening for depression: Secondary | ICD-10-CM | POA: Diagnosis not present

## 2022-12-02 DIAGNOSIS — M159 Polyosteoarthritis, unspecified: Secondary | ICD-10-CM | POA: Diagnosis not present

## 2022-12-02 DIAGNOSIS — D696 Thrombocytopenia, unspecified: Secondary | ICD-10-CM | POA: Diagnosis not present

## 2022-12-02 DIAGNOSIS — Z0001 Encounter for general adult medical examination with abnormal findings: Secondary | ICD-10-CM | POA: Diagnosis not present

## 2022-12-02 DIAGNOSIS — M1991 Primary osteoarthritis, unspecified site: Secondary | ICD-10-CM | POA: Diagnosis not present

## 2022-12-02 DIAGNOSIS — D518 Other vitamin B12 deficiency anemias: Secondary | ICD-10-CM | POA: Diagnosis not present

## 2022-12-02 DIAGNOSIS — K219 Gastro-esophageal reflux disease without esophagitis: Secondary | ICD-10-CM | POA: Diagnosis not present

## 2022-12-02 DIAGNOSIS — Z6824 Body mass index (BMI) 24.0-24.9, adult: Secondary | ICD-10-CM | POA: Diagnosis not present

## 2022-12-02 DIAGNOSIS — G9332 Myalgic encephalomyelitis/chronic fatigue syndrome: Secondary | ICD-10-CM | POA: Diagnosis not present

## 2022-12-02 DIAGNOSIS — E559 Vitamin D deficiency, unspecified: Secondary | ICD-10-CM | POA: Diagnosis not present

## 2022-12-02 DIAGNOSIS — Z125 Encounter for screening for malignant neoplasm of prostate: Secondary | ICD-10-CM | POA: Diagnosis not present

## 2022-12-07 DIAGNOSIS — L298 Other pruritus: Secondary | ICD-10-CM | POA: Diagnosis not present

## 2022-12-07 DIAGNOSIS — L821 Other seborrheic keratosis: Secondary | ICD-10-CM | POA: Diagnosis not present

## 2022-12-07 DIAGNOSIS — D1801 Hemangioma of skin and subcutaneous tissue: Secondary | ICD-10-CM | POA: Diagnosis not present

## 2022-12-07 DIAGNOSIS — L814 Other melanin hyperpigmentation: Secondary | ICD-10-CM | POA: Diagnosis not present

## 2022-12-11 DIAGNOSIS — H2512 Age-related nuclear cataract, left eye: Secondary | ICD-10-CM | POA: Diagnosis not present

## 2022-12-15 DIAGNOSIS — H2512 Age-related nuclear cataract, left eye: Secondary | ICD-10-CM | POA: Diagnosis not present

## 2023-01-18 DIAGNOSIS — N401 Enlarged prostate with lower urinary tract symptoms: Secondary | ICD-10-CM | POA: Diagnosis not present

## 2023-01-18 DIAGNOSIS — R3915 Urgency of urination: Secondary | ICD-10-CM | POA: Diagnosis not present

## 2023-01-18 DIAGNOSIS — R972 Elevated prostate specific antigen [PSA]: Secondary | ICD-10-CM | POA: Diagnosis not present

## 2023-01-25 DIAGNOSIS — Z6824 Body mass index (BMI) 24.0-24.9, adult: Secondary | ICD-10-CM | POA: Diagnosis not present

## 2023-01-25 DIAGNOSIS — R1012 Left upper quadrant pain: Secondary | ICD-10-CM | POA: Diagnosis not present

## 2023-01-25 DIAGNOSIS — D696 Thrombocytopenia, unspecified: Secondary | ICD-10-CM | POA: Diagnosis not present

## 2023-01-25 DIAGNOSIS — R1902 Left upper quadrant abdominal swelling, mass and lump: Secondary | ICD-10-CM | POA: Diagnosis not present

## 2023-01-26 ENCOUNTER — Other Ambulatory Visit (HOSPITAL_BASED_OUTPATIENT_CLINIC_OR_DEPARTMENT_OTHER): Payer: Self-pay | Admitting: Family Medicine

## 2023-01-26 DIAGNOSIS — R1012 Left upper quadrant pain: Secondary | ICD-10-CM

## 2023-01-26 DIAGNOSIS — R1902 Left upper quadrant abdominal swelling, mass and lump: Secondary | ICD-10-CM

## 2023-01-27 ENCOUNTER — Ambulatory Visit (HOSPITAL_BASED_OUTPATIENT_CLINIC_OR_DEPARTMENT_OTHER)
Admission: RE | Admit: 2023-01-27 | Discharge: 2023-01-27 | Disposition: A | Discharge status: Home / Self Care | Payer: PPO | Source: Ambulatory Visit | Attending: Family Medicine | Admitting: Family Medicine

## 2023-01-27 ENCOUNTER — Encounter (HOSPITAL_BASED_OUTPATIENT_CLINIC_OR_DEPARTMENT_OTHER): Payer: Self-pay

## 2023-01-27 DIAGNOSIS — R1012 Left upper quadrant pain: Secondary | ICD-10-CM | POA: Insufficient documentation

## 2023-01-27 DIAGNOSIS — R1902 Left upper quadrant abdominal swelling, mass and lump: Secondary | ICD-10-CM | POA: Insufficient documentation

## 2023-01-27 DIAGNOSIS — N400 Enlarged prostate without lower urinary tract symptoms: Secondary | ICD-10-CM | POA: Diagnosis not present

## 2023-01-27 DIAGNOSIS — K4020 Bilateral inguinal hernia, without obstruction or gangrene, not specified as recurrent: Secondary | ICD-10-CM | POA: Diagnosis not present

## 2023-01-27 DIAGNOSIS — K5730 Diverticulosis of large intestine without perforation or abscess without bleeding: Secondary | ICD-10-CM | POA: Diagnosis not present

## 2023-01-27 MED ORDER — IOHEXOL 300 MG/ML  SOLN
100.0000 mL | Freq: Once | INTRAMUSCULAR | Status: AC | PRN
  Administered 2023-01-27: 85 mL via INTRAVENOUS

## 2023-01-30 LAB — POCT I-STAT CREATININE: Creatinine, Ser: 0.8 mg/dL (ref 0.61–1.24)

## 2023-02-01 DIAGNOSIS — K4020 Bilateral inguinal hernia, without obstruction or gangrene, not specified as recurrent: Secondary | ICD-10-CM | POA: Diagnosis not present

## 2023-02-01 DIAGNOSIS — Z6824 Body mass index (BMI) 24.0-24.9, adult: Secondary | ICD-10-CM | POA: Diagnosis not present

## 2023-02-01 DIAGNOSIS — H6123 Impacted cerumen, bilateral: Secondary | ICD-10-CM | POA: Diagnosis not present

## 2023-03-21 ENCOUNTER — Other Ambulatory Visit: Payer: Self-pay | Admitting: Surgery

## 2023-03-21 DIAGNOSIS — K4020 Bilateral inguinal hernia, without obstruction or gangrene, not specified as recurrent: Secondary | ICD-10-CM | POA: Diagnosis not present

## 2023-03-21 DIAGNOSIS — K429 Umbilical hernia without obstruction or gangrene: Secondary | ICD-10-CM | POA: Diagnosis not present

## 2023-06-07 ENCOUNTER — Ambulatory Visit
Admission: RE | Admit: 2023-06-07 | Discharge: 2023-06-07 | Disposition: A | Discharge status: Home / Self Care | Payer: PPO | Source: Ambulatory Visit | Attending: Otolaryngology | Admitting: Otolaryngology

## 2023-06-07 ENCOUNTER — Other Ambulatory Visit: Payer: Self-pay | Admitting: Otolaryngology

## 2023-06-07 DIAGNOSIS — R058 Other specified cough: Secondary | ICD-10-CM

## 2023-06-07 DIAGNOSIS — Z4789 Encounter for other orthopedic aftercare: Secondary | ICD-10-CM | POA: Diagnosis not present

## 2023-06-07 DIAGNOSIS — R490 Dysphonia: Secondary | ICD-10-CM | POA: Diagnosis not present

## 2023-07-03 DIAGNOSIS — K4020 Bilateral inguinal hernia, without obstruction or gangrene, not specified as recurrent: Secondary | ICD-10-CM | POA: Diagnosis not present

## 2023-07-03 DIAGNOSIS — K429 Umbilical hernia without obstruction or gangrene: Secondary | ICD-10-CM | POA: Diagnosis not present

## 2023-07-21 DIAGNOSIS — H43813 Vitreous degeneration, bilateral: Secondary | ICD-10-CM | POA: Diagnosis not present

## 2023-07-21 DIAGNOSIS — H04123 Dry eye syndrome of bilateral lacrimal glands: Secondary | ICD-10-CM | POA: Diagnosis not present

## 2023-07-21 DIAGNOSIS — H02834 Dermatochalasis of left upper eyelid: Secondary | ICD-10-CM | POA: Diagnosis not present

## 2023-07-21 DIAGNOSIS — Z961 Presence of intraocular lens: Secondary | ICD-10-CM | POA: Diagnosis not present

## 2023-07-21 DIAGNOSIS — H02831 Dermatochalasis of right upper eyelid: Secondary | ICD-10-CM | POA: Diagnosis not present

## 2023-07-21 DIAGNOSIS — E119 Type 2 diabetes mellitus without complications: Secondary | ICD-10-CM | POA: Diagnosis not present

## 2023-09-21 DIAGNOSIS — Z6824 Body mass index (BMI) 24.0-24.9, adult: Secondary | ICD-10-CM | POA: Diagnosis not present

## 2023-09-21 DIAGNOSIS — Z9229 Personal history of other drug therapy: Secondary | ICD-10-CM | POA: Diagnosis not present

## 2023-09-21 DIAGNOSIS — E1165 Type 2 diabetes mellitus with hyperglycemia: Secondary | ICD-10-CM | POA: Diagnosis not present

## 2023-09-21 DIAGNOSIS — K219 Gastro-esophageal reflux disease without esophagitis: Secondary | ICD-10-CM | POA: Diagnosis not present

## 2023-09-21 DIAGNOSIS — E1140 Type 2 diabetes mellitus with diabetic neuropathy, unspecified: Secondary | ICD-10-CM | POA: Diagnosis not present

## 2023-09-21 DIAGNOSIS — Z0001 Encounter for general adult medical examination with abnormal findings: Secondary | ICD-10-CM | POA: Diagnosis not present

## 2023-09-21 DIAGNOSIS — M159 Polyosteoarthritis, unspecified: Secondary | ICD-10-CM | POA: Diagnosis not present

## 2024-01-24 DIAGNOSIS — R3915 Urgency of urination: Secondary | ICD-10-CM | POA: Diagnosis not present

## 2024-01-24 DIAGNOSIS — N401 Enlarged prostate with lower urinary tract symptoms: Secondary | ICD-10-CM | POA: Diagnosis not present

## 2024-01-24 DIAGNOSIS — R81 Glycosuria: Secondary | ICD-10-CM | POA: Diagnosis not present

## 2024-03-25 DIAGNOSIS — K219 Gastro-esophageal reflux disease without esophagitis: Secondary | ICD-10-CM | POA: Diagnosis not present

## 2024-03-25 DIAGNOSIS — N400 Enlarged prostate without lower urinary tract symptoms: Secondary | ICD-10-CM | POA: Diagnosis not present

## 2024-03-25 DIAGNOSIS — Z6824 Body mass index (BMI) 24.0-24.9, adult: Secondary | ICD-10-CM | POA: Diagnosis not present

## 2024-03-25 DIAGNOSIS — E559 Vitamin D deficiency, unspecified: Secondary | ICD-10-CM | POA: Diagnosis not present

## 2024-03-25 DIAGNOSIS — Z0001 Encounter for general adult medical examination with abnormal findings: Secondary | ICD-10-CM | POA: Diagnosis not present

## 2024-03-25 DIAGNOSIS — D518 Other vitamin B12 deficiency anemias: Secondary | ICD-10-CM | POA: Diagnosis not present

## 2024-03-25 DIAGNOSIS — E1165 Type 2 diabetes mellitus with hyperglycemia: Secondary | ICD-10-CM | POA: Diagnosis not present

## 2024-03-25 DIAGNOSIS — Z1331 Encounter for screening for depression: Secondary | ICD-10-CM | POA: Diagnosis not present

## 2024-04-06 ENCOUNTER — Encounter (HOSPITAL_BASED_OUTPATIENT_CLINIC_OR_DEPARTMENT_OTHER): Payer: Self-pay

## 2024-04-06 ENCOUNTER — Other Ambulatory Visit: Payer: Self-pay

## 2024-04-06 ENCOUNTER — Emergency Department (HOSPITAL_BASED_OUTPATIENT_CLINIC_OR_DEPARTMENT_OTHER)

## 2024-04-06 ENCOUNTER — Emergency Department (HOSPITAL_BASED_OUTPATIENT_CLINIC_OR_DEPARTMENT_OTHER)
Admission: EM | Admit: 2024-04-06 | Discharge: 2024-04-06 | Disposition: A | Discharge status: Home / Self Care | Attending: Emergency Medicine | Admitting: Emergency Medicine

## 2024-04-06 DIAGNOSIS — N400 Enlarged prostate without lower urinary tract symptoms: Secondary | ICD-10-CM | POA: Diagnosis not present

## 2024-04-06 DIAGNOSIS — R1013 Epigastric pain: Secondary | ICD-10-CM | POA: Diagnosis not present

## 2024-04-06 DIAGNOSIS — K760 Fatty (change of) liver, not elsewhere classified: Secondary | ICD-10-CM | POA: Diagnosis not present

## 2024-04-06 DIAGNOSIS — E119 Type 2 diabetes mellitus without complications: Secondary | ICD-10-CM | POA: Insufficient documentation

## 2024-04-06 DIAGNOSIS — Z7984 Long term (current) use of oral hypoglycemic drugs: Secondary | ICD-10-CM | POA: Diagnosis not present

## 2024-04-06 DIAGNOSIS — K5730 Diverticulosis of large intestine without perforation or abscess without bleeding: Secondary | ICD-10-CM | POA: Diagnosis not present

## 2024-04-06 DIAGNOSIS — Z96641 Presence of right artificial hip joint: Secondary | ICD-10-CM | POA: Diagnosis not present

## 2024-04-06 DIAGNOSIS — Z87891 Personal history of nicotine dependence: Secondary | ICD-10-CM | POA: Diagnosis not present

## 2024-04-06 DIAGNOSIS — R9431 Abnormal electrocardiogram [ECG] [EKG]: Secondary | ICD-10-CM | POA: Diagnosis not present

## 2024-04-06 DIAGNOSIS — K2900 Acute gastritis without bleeding: Secondary | ICD-10-CM | POA: Insufficient documentation

## 2024-04-06 DIAGNOSIS — Z7982 Long term (current) use of aspirin: Secondary | ICD-10-CM | POA: Diagnosis not present

## 2024-04-06 DIAGNOSIS — Z85828 Personal history of other malignant neoplasm of skin: Secondary | ICD-10-CM | POA: Insufficient documentation

## 2024-04-06 DIAGNOSIS — K4090 Unilateral inguinal hernia, without obstruction or gangrene, not specified as recurrent: Secondary | ICD-10-CM | POA: Diagnosis not present

## 2024-04-06 LAB — COMPREHENSIVE METABOLIC PANEL WITH GFR
ALT: 13 U/L (ref 0–44)
AST: 19 U/L (ref 15–41)
Albumin: 4.5 g/dL (ref 3.5–5.0)
Alkaline Phosphatase: 69 U/L (ref 38–126)
Anion gap: 11 (ref 5–15)
BUN: 13 mg/dL (ref 8–23)
CO2: 27 mmol/L (ref 22–32)
Calcium: 10.1 mg/dL (ref 8.9–10.3)
Chloride: 100 mmol/L (ref 98–111)
Creatinine, Ser: 0.92 mg/dL (ref 0.61–1.24)
GFR, Estimated: >60 mL/min (ref >60)
Glucose, Bld: 188 mg/dL — ABNORMAL HIGH (ref 70–99)
Potassium: 4.4 mmol/L (ref 3.5–5.1)
Sodium: 138 mmol/L (ref 135–145)
Total Bilirubin: 0.8 mg/dL (ref 0.0–1.2)
Total Protein: 7.6 g/dL (ref 6.5–8.1)

## 2024-04-06 LAB — CBC WITH DIFFERENTIAL/PLATELET
Abs Immature Granulocytes: 0.06 K/uL (ref 0.00–0.07)
Basophils Absolute: 0 K/uL (ref 0.0–0.1)
Basophils Relative: 0 %
Eosinophils Absolute: 0.2 K/uL (ref 0.0–0.5)
Eosinophils Relative: 3 %
HCT: 44.1 % (ref 39.0–52.0)
Hemoglobin: 14.9 g/dL (ref 13.0–17.0)
Immature Granulocytes: 1 %
Lymphocytes Relative: 13 %
Lymphs Abs: 0.9 K/uL (ref 0.7–4.0)
MCH: 30.2 pg (ref 26.0–34.0)
MCHC: 33.8 g/dL (ref 30.0–36.0)
MCV: 89.3 fL (ref 80.0–100.0)
Monocytes Absolute: 0.5 K/uL (ref 0.1–1.0)
Monocytes Relative: 7 %
Neutro Abs: 5.2 K/uL (ref 1.7–7.7)
Neutrophils Relative %: 76 %
Platelets: 171 K/uL (ref 150–400)
RBC: 4.94 MIL/uL (ref 4.22–5.81)
RDW: 13.8 % (ref 11.5–15.5)
WBC: 6.8 K/uL (ref 4.0–10.5)
nRBC: 0 % (ref 0.0–0.2)

## 2024-04-06 LAB — URINALYSIS, W/ REFLEX TO CULTURE (INFECTION SUSPECTED)
Bacteria, UA: NONE SEEN
Bilirubin Urine: NEGATIVE
Glucose, UA: 500 mg/dL — AB
Hgb urine dipstick: NEGATIVE
Ketones, ur: NEGATIVE mg/dL
Leukocytes,Ua: NEGATIVE
Nitrite: NEGATIVE
Protein, ur: NEGATIVE mg/dL
Specific Gravity, Urine: 1.018 (ref 1.005–1.030)
pH: 5 (ref 5.0–8.0)

## 2024-04-06 LAB — LIPASE, BLOOD: Lipase: 45 U/L (ref 11–51)

## 2024-04-06 LAB — TROPONIN T, HIGH SENSITIVITY
Troponin T High Sensitivity: 17 ng/L (ref <19)
Troponin T High Sensitivity: 17 ng/L (ref <19)

## 2024-04-06 MED ORDER — PANTOPRAZOLE SODIUM 40 MG IV SOLR
40.0000 mg | Freq: Once | INTRAVENOUS | Status: AC
  Administered 2024-04-06: 40 mg via INTRAVENOUS
  Filled 2024-04-06: qty 10

## 2024-04-06 MED ORDER — ALUM & MAG HYDROXIDE-SIMETH 200-200-20 MG/5ML PO SUSP
15.0000 mL | Freq: Once | ORAL | Status: AC
  Administered 2024-04-06: 15 mL via ORAL
  Filled 2024-04-06: qty 30

## 2024-04-06 MED ORDER — FAMOTIDINE 20 MG PO TABS: 20.0000 mg | ORAL_TABLET | Freq: Every day | ORAL | 0 refills | Status: AC

## 2024-04-06 MED ORDER — DICYCLOMINE HCL 10 MG PO CAPS
10.0000 mg | ORAL_CAPSULE | Freq: Once | ORAL | Status: AC
  Administered 2024-04-06: 10 mg via ORAL
  Filled 2024-04-06: qty 1

## 2024-04-06 MED ORDER — IOHEXOL 300 MG/ML  SOLN
100.0000 mL | Freq: Once | INTRAMUSCULAR | Status: AC | PRN
  Administered 2024-04-06: 100 mL via INTRAVENOUS

## 2024-04-06 MED ORDER — MAALOX MAX 400-400-40 MG/5ML PO SUSP: 15.0000 mL | Freq: Four times a day (QID) | ORAL | 0 refills | Status: AC | PRN

## 2024-04-06 NOTE — Discharge Instructions (Addendum)
 While you were in the emergency room, you were diagnosed with gastritis.  This is inflammation of the lining of your stomach.  I have sent you a prescription for famotidine /Protonix .  This medication can help out with the inflammation of the lining of your stomach.  Please follow-up with your primary care doctor within 1 to 2 weeks.  Return to the emergency department develop new or worsening pain in your abdomen.

## 2024-04-06 NOTE — ED Provider Notes (Signed)
 [place] EMERGENCY DEPARTMENT AT [facility] Provider Note  CSN: [id number] Arrival date & time: 04/06/24 1732  Chief Complaint(s) Abdominal Pain and Epigastric Pain   HPI Cody Johns is a 88 y.o. male who is here today for epigastric pain of a couple days duration.  Patient has noticed that he intermittently will have severe pain in his mid abdomen.  Denies any fever, chills.  Has not had an appetite, has felt as though his abdomen is distended.  No history of intra-abdominal surgeries.   Past Medical History Past Medical History:  Diagnosis Date   Degenerative joint disease involving multiple joints    Diabetes (HCC)    GERD (gastroesophageal reflux disease)    Hyperglyceridemia    IBS (irritable bowel syndrome)    SCCA (squamous cell carcinoma) of skin 04/30/2020   Left forearm posterior   Patient Active Problem List   Diagnosis Date Noted   Encounter for screening colonoscopy 02/05/2019   Hyperglyceridemia    IBS (irritable bowel syndrome)    Degenerative joint disease involving multiple joints    Home Medication(s) Prior to Admission medications   Medication Sig Start Date End Date Taking? Authorizing Provider  alum & mag hydroxide-simeth (MAALOX MAX) 400-400-40 MG/5ML suspension Take 15 mLs by mouth every 6 (six) hours as needed for indigestion. 04/06/24  Yes Mannie Pac T, DO  famotidine  (PEPCID ) 20 MG tablet Take 1 tablet (20 mg total) by mouth daily. 04/06/24 05/06/24 Yes Mannie Pac T, DO  acetaminophen (TYLENOL) 500 MG tablet Take 500-1,000 mg by mouth every 6 (six) hours as needed (for pain.).    [provider]  aspirin 325 MG tablet Take by mouth.    [provider]  baclofen (LIORESAL) 10 MG tablet Take 10 mg by mouth 2 (two) times daily as needed (muscle tightness). 01/02/19   [provider]  Carboxymeth-Glycerin-Polysorb (REFRESH OPTIVE ADVANCED) 0.5-1-0.5 % SOLN Place 1 drop into both eyes 3 (three) times daily as  needed (dry/irritated eyes.).    [provider]  Glucosamine-Chondroitin-MSM-D3 TABS Take 2 tablets by mouth daily.    [provider]  loperamide (IMODIUM) 2 MG capsule Take 2-4 mg by mouth 4 (four) times daily as needed for diarrhea or loose stools.    [provider]  Probiotic Product (PROBIOTIC DAILY) CAPS Take 1 capsule by mouth daily.    [provider]  sitaGLIPtin-metformin (JANUMET) 50-1000 MG tablet Take 1 tablet by mouth daily.    [provider]  tamsulosin (FLOMAX) 0.4 MG CAPS capsule Take 0.4 mg by mouth every evening. 01/16/19   [provider]                                                                                                                                    Past Surgical History Past Surgical History:  Procedure Laterality Date   BACK SURGERY  08/2005   SPINE SURGERY FOR DISC  DISEASE   Rt hip replacement     2019   Family History History reviewed. No pertinent family history.  Social History Social History   Tobacco Use   Smoking status: Former    Current packs/day: 2.00    Average packs/day: 2.0 packs/day for 10.0 years (20.0 ttl pk-yrs)    Types: Cigarettes   Smokeless tobacco: Never  Substance Use Topics   Alcohol use: No   Drug use: No   Allergies Patient has no known allergies.  Review of Systems Review of Systems  Physical Exam Vital Signs  I have reviewed the triage vital signs BP 136/79   Pulse 76   Temp (!) 97.4 F (36.3 C) (Oral)   Resp 13   Ht 6' (1.829 m)   Wt 84.4 kg   SpO2 99%   BMI 25.23 kg/m   Physical Exam Vitals and nursing note reviewed.  Abdominal:     General: Abdomen is flat. There is no distension.     Palpations: Abdomen is soft.     Tenderness: There is abdominal tenderness in the epigastric area. Negative signs include Murphy's sign.     ED Results and Treatments Labs (all labs ordered are listed, but only abnormal results are displayed) Labs  Reviewed  COMPREHENSIVE METABOLIC PANEL WITH GFR - Abnormal; Notable for the following components:      Result Value   Glucose, Bld 188 (*)    All other components within normal limits  URINALYSIS, W/ REFLEX TO CULTURE (INFECTION SUSPECTED) - Abnormal; Notable for the following components:   Glucose, UA 500 (*)    All other components within normal limits  CBC WITH DIFFERENTIAL/PLATELET  LIPASE, BLOOD  TROPONIN T, HIGH SENSITIVITY  TROPONIN T, HIGH SENSITIVITY                                                                                                                          Radiology CT ABDOMEN PELVIS W CONTRAST Result Date: 04/06/2024 CLINICAL DATA:  Abdominal pain, acute, nonlocalized. Intermittent epigastric pain. EXAM: CT ABDOMEN AND PELVIS WITH CONTRAST TECHNIQUE: Multidetector CT imaging of the abdomen and pelvis was performed using the standard protocol following bolus administration of intravenous contrast. RADIATION DOSE REDUCTION: This exam was performed according to the departmental dose-optimization program which includes automated exposure control, adjustment of the mA and/or kV according to patient size and/or use of iterative reconstruction technique. CONTRAST:  OMNIPAQUE  IOHEXOL  300 MG/ML  SOLN COMPARISON:  01/27/2023. FINDINGS: Lower chest: No acute abnormality. Hepatobiliary: No focal liver abnormality is seen. Fatty infiltration of the liver is noted. No gallstones, gallbladder wall thickening, or biliary dilatation. Pancreas: Unremarkable. No pancreatic ductal dilatation or surrounding inflammatory changes. Spleen: Normal size. Perisplenic free fluid is noted and unchanged from 2024. Adrenals/Urinary Tract: The adrenal glands are within normal limits. The kidneys enhance symmetrically. Subcentimeter hypodensities are present in the kidneys, possible cysts. No renal calculus or hydronephrosis bilaterally. The urinary bladder is not completely seen due to  metallic streak  artifact. The anterior aspect of the urinary bladder on the left extends into a left inguinal hernia. Stomach/Bowel: There is mild gastric wall thickening at the gastric antrum with fat stranding in the gastrohepatic ligament. No bowel obstruction, free air, or pneumatosis is seen. Scattered diverticula are present along the colon without evidence of diverticulitis. There are bilateral inguinal hernias containing nonobstructed small bowel. Appendix is not well seen. Vascular/Lymphatic: Aortic atherosclerosis. No enlarged abdominal or pelvic lymph nodes. Reproductive: Prostate gland is enlarged. Other: No abdominopelvic ascites. There is a fat containing periumbilical hernia. Musculoskeletal: Right hip arthroplasty changes are noted. Mild degenerative changes are noted in the thoracolumbar spine. IMPRESSION: 1. Gastric wall thickening with fat stranding in the gastrohepatic ligament suggesting gastritis. 2. Bilateral inguinal hernias containing nonobstructed small bowel. A portion of the anterior urinary bladder on the left extends into the left inguinal hernia. 3. Diverticulosis without diverticulitis. 4. Hepatic steatosis. 5. Small perisplenic fluid collection, unchanged from 2024. 6. Enlarged prostate gland. 7. Aortic atherosclerosis. Electronically Signed   By: Leita Birmingham M.D.   On: 04/06/2024 19:31   US  Abdomen Limited RUQ (LIVER/GB) Result Date: 04/06/2024 CLINICAL DATA:  Epigastric pain. EXAM: ULTRASOUND ABDOMEN LIMITED RIGHT UPPER QUADRANT COMPARISON:  01/27/2023. FINDINGS: Gallbladder: Not visualized. Common bile duct: Not visualized. Liver: No focal lesion identified. Increased parenchymal echogenicity. Portal vein is patent on color Doppler imaging with normal direction of blood flow towards the liver. Other: Examination is limited due to large amount of bowel gas. IMPRESSION: 1. Hepatic steatosis. 2. Gallbladder and common bile duct are not seen due to overlying bowel gas. Electronically Signed   By:  Leita Birmingham M.D.   On: 04/06/2024 18:38    Pertinent labs & imaging results that were available during my care of the patient were reviewed by me and considered in my medical decision making (see MDM for details).  Medications Ordered in ED Medications  iohexol  (OMNIPAQUE ) 300 MG/ML solution 100 mL (100 mLs Intravenous Contrast Given 04/06/24 1856)  pantoprazole  (PROTONIX ) injection 40 mg (40 mg Intravenous Given 04/06/24 2024)  alum & mag hydroxide-simeth (MAALOX/MYLANTA) 200-200-20 MG/5ML suspension 15 mL (15 mLs Oral Given 04/06/24 2029)  dicyclomine  (BENTYL ) capsule 10 mg (10 mg Oral Given 04/06/24 2028)                                                                                                                                     Procedures Procedures  (including critical care time)  Medical Decision Making / ED Course   This patient presents to the ED for concern of epigastric pain, this involves an extensive number of treatment options, and is a complaint that carries with it a high risk of complications and morbidity.  The differential diagnosis includes gastritis, pancreatitis, cholelithiasis, cholecystitis, ACS, less likely dissection.  MDM: Patient overall well-appearing.  Has a soft abdomen, however has epigastric tenderness.  Labs ordered, CT and ultrasound  ordered.  EKG ordered.  Reassessment 8:40 PM-patient is ultrasound limited by bowel gas, CT imaging showing gastritis.  Blood work overall unremarkable, no leukocytosis, no anemia.  Renal function normal.  Lipase negative.  Troponin not elevated at 17, no rise.  Patient feels a bit better after the medications here in the ED.  Will discharge home with prescription for famotidine , have him follow-up with his PCP.   Additional history obtained: -Additional history obtained from wife at bedside -External records from outside source obtained and reviewed including: Chart review including previous notes, labs, imaging,  consultation notes   Lab Tests: -I ordered, reviewed, and interpreted labs.   The pertinent results include:   Labs Reviewed  COMPREHENSIVE METABOLIC PANEL WITH GFR - Abnormal; Notable for the following components:      Result Value   Glucose, Bld 188 (*)    All other components within normal limits  URINALYSIS, W/ REFLEX TO CULTURE (INFECTION SUSPECTED) - Abnormal; Notable for the following components:   Glucose, UA 500 (*)    All other components within normal limits  CBC WITH DIFFERENTIAL/PLATELET  LIPASE, BLOOD  TROPONIN T, HIGH SENSITIVITY  TROPONIN T, HIGH SENSITIVITY      EKG sinus rhythm, no ST segment depressions or elevations, no acute ischemia.  EKG Interpretation Date/Time:  Saturday April 06 2024 17:53:33 EDT Ventricular Rate:  82 PR Interval:  57 QRS Duration:  113 QT Interval:  379 QTC Calculation: 443 R Axis:   -70  Text Interpretation: Sinus rhythm Short PR interval Left anterior fascicular block Abnormal R-wave progression, late transition Confirmed by Mannie Pac [phone]) on 04/06/2024 6:07:22 PM         Imaging Studies ordered: I ordered imaging studies including ultrasound gallbladder, CT abdomen pelvis I independently visualized and interpreted imaging. I agree with the radiologist interpretation   Medicines ordered and prescription drug management: Meds ordered this encounter  Medications   iohexol  (OMNIPAQUE ) 300 MG/ML solution 100 mL   pantoprazole  (PROTONIX ) injection 40 mg   alum & mag hydroxide-simeth (MAALOX/MYLANTA) 200-200-20 MG/5ML suspension 15 mL   dicyclomine  (BENTYL ) capsule 10 mg   famotidine  (PEPCID ) 20 MG tablet    Sig: Take 1 tablet (20 mg total) by mouth daily.    Dispense:  30 tablet    Refill:  0   alum & mag hydroxide-simeth (MAALOX MAX) 400-400-40 MG/5ML suspension    Sig: Take 15 mLs by mouth every 6 (six) hours as needed for indigestion.    Dispense:  355 mL    Refill:  0    -I have reviewed the patients home  medicines and have made adjustments as needed  Cardiac Monitoring: The patient was maintained on a cardiac monitor.  I personally viewed and interpreted the cardiac monitored which showed an underlying rhythm of: Normal sinus rhythm  Social Determinants of Health:  Factors impacting patients care include: Lack of access to primary care   Reevaluation: After the interventions noted above, I reevaluated the patient and found that they have :improved  Co morbidities that complicate the patient evaluation  Past Medical History:  Diagnosis Date   Degenerative joint disease involving multiple joints    Diabetes (HCC)    GERD (gastroesophageal reflux disease)    Hyperglyceridemia    IBS (irritable bowel syndrome)    SCCA (squamous cell carcinoma) of skin 04/30/2020   Left forearm posterior      Dispostion: I considered admission for this patient, however with his reassuring workup he is appropriate for discharge.  Final Clinical Impression(s) / ED Diagnoses Final diagnoses:  Acute gastritis without hemorrhage, unspecified gastritis type     @PCDICTATION @    Mannie Pac T, DO 04/06/24 2043

## 2024-04-06 NOTE — ED Triage Notes (Signed)
 Arrives POV with complaints on intermittent, 8/10 epigastric pain. No vomiting or diarrhea.

## 2024-04-06 NOTE — ED Notes (Signed)
 Pt ambulatory self to bathroom

## 2024-04-06 NOTE — ED Notes (Signed)
 Patient transported to CT

## 2024-04-16 DIAGNOSIS — K219 Gastro-esophageal reflux disease without esophagitis: Secondary | ICD-10-CM | POA: Diagnosis not present

## 2024-04-16 DIAGNOSIS — Z6824 Body mass index (BMI) 24.0-24.9, adult: Secondary | ICD-10-CM | POA: Diagnosis not present

## 2024-04-25 ENCOUNTER — Telehealth: Payer: Self-pay | Admitting: Internal Medicine

## 2024-04-25 NOTE — Telephone Encounter (Signed)
 Patient called and asked that his records be sent to [facility] GI.  Sent 04/25/24

## 2024-04-29 ENCOUNTER — Ambulatory Visit: Admitting: Nutrition

## 2024-05-14 ENCOUNTER — Encounter: Attending: Family Medicine | Admitting: Nutrition

## 2024-05-14 ENCOUNTER — Encounter: Payer: Self-pay | Admitting: Nutrition

## 2024-05-14 VITALS — Ht 72.0 in | Wt 183.0 lb

## 2024-05-14 DIAGNOSIS — E118 Type 2 diabetes mellitus with unspecified complications: Secondary | ICD-10-CM | POA: Diagnosis not present

## 2024-05-14 NOTE — Progress Notes (Signed)
 Medical Nutrition Therapy  Appointment Start time:  0830  Appointment End time: 1000  Primary concerns today: Dm Type 2  Referral diagnosis: E11.8 Preferred learning style: Ready  and see  Learning readiness: Ready    NUTRITION ASSESSMENT  88 yr old wmale referred for Type 2 DM from Dr. Marvine. A1C 7.3%. He has been eating a lot of sugar free products that he thought were ok. Fairly active. Admits his eating habits could improve. Eats 2 meals per day and is a grazer. Currently on Janument 50/1000 BID. Hasn't been testing blood sugars  Willing to work on Lifesytyle Medicine with focusing on more of a whole plant based diet to reverse his diabetes. Clinical Medical Hx:  Past Medical History:  Diagnosis Date   Degenerative joint disease involving multiple joints    Diabetes (HCC)    GERD (gastroesophageal reflux disease)    Hyperglyceridemia    IBS (irritable bowel syndrome)    SCCA (squamous cell carcinoma) of skin 04/30/2020   Left forearm posterior    Medications:  Current Outpatient Medications on File Prior to Visit  Medication Sig Dispense Refill   acetaminophen (TYLENOL) 500 MG tablet Take 500-1,000 mg by mouth every 6 (six) hours as needed (for pain.).     alum & mag hydroxide-simeth (MAALOX MAX) 400-400-40 MG/5ML suspension Take 15 mLs by mouth every 6 (six) hours as needed for indigestion. 355 mL 0   aspirin 325 MG tablet Take by mouth.     baclofen (LIORESAL) 10 MG tablet Take 10 mg by mouth 2 (two) times daily as needed (muscle tightness).     Carboxymeth-Glycerin-Polysorb (REFRESH OPTIVE ADVANCED) 0.5-1-0.5 % SOLN Place 1 drop into both eyes 3 (three) times daily as needed (dry/irritated eyes.).     famotidine  (PEPCID ) 20 MG tablet Take 1 tablet (20 mg total) by mouth daily. 30 tablet 0   Glucosamine-Chondroitin-MSM-D3 TABS Take 2 tablets by mouth daily.     loperamide (IMODIUM) 2 MG capsule Take 2-4 mg by mouth 4 (four) times daily as needed for diarrhea or loose  stools.     Probiotic Product (PROBIOTIC DAILY) CAPS Take 1 capsule by mouth daily.     sitaGLIPtin-metformin (JANUMET) 50-1000 MG tablet Take 1 tablet by mouth daily.     tamsulosin (FLOMAX) 0.4 MG CAPS capsule Take 0.4 mg by mouth every evening.     No current facility-administered medications on file prior to visit.    Labs: A1C 7.3%    Latest Ref Rng & Units 04/06/2024    5:58 PM 01/27/2023    8:42 AM 02/01/2019    8:30 AM  CMP  Glucose 70 - 99 mg/dL 811     BUN 8 - 23 mg/dL 13     Creatinine 9.38 - 1.24 mg/dL 9.07  9.19  9.09   Sodium 135 - 145 mmol/L 138     Potassium 3.5 - 5.1 mmol/L 4.4     Chloride 98 - 111 mmol/L 100     CO2 22 - 32 mmol/L 27     Calcium 8.9 - 10.3 mg/dL 89.8     Total Protein 6.5 - 8.1 g/dL 7.6     Total Bilirubin 0.0 - 1.2 mg/dL 0.8     Alkaline Phos 38 - 126 U/L 69     AST 15 - 41 U/L 19     ALT 0 - 44 U/L 13      Lipid Panel  No results found for: CHOL, TRIG, HDL, CHOLHDL, VLDL, LDLCALC, LDLDIRECT,  LABVLDL  Notable Signs/Symptoms: none  Lifestyle & Dietary Hx Lives with his wife  Estimated daily fluid intake: 40 oz Supplements: Sleep:  Stress / self-care:  Current average weekly physical activity: Walks some; active  24-Hr Dietary Recall Eats 2 meals per day and 'grazes'  Eats sf product Had been told to avoid fruit  Estimated Energy Needs Calories: 1800 Carbohydrate: 200g Protein: 135g Fat: 50g   NUTRITION DIAGNOSIS  NB-1.1 Food and nutrition-related knowledge deficit As related to inconsistent diet to meet his needs.  As evidenced by A1C 7.3%.   NUTRITION INTERVENTION  Nutrition education (E-1) on the following topics:  Nutrition and Diabetes education provided on My Plate, CHO counting, meal planning, portion sizes, timing of meals, avoiding snacks between meals unless having a low blood sugar, target ranges for A1C and blood sugars, signs/symptoms and treatment of hyper/hypoglycemia, monitoring blood sugars,  taking medications as prescribed, benefits of exercising 30 minutes per day and prevention of complications of DM.  Lifestyle Medicine  - Whole Food, Plant Predominant Nutrition is highly recommended: Eat Plenty of vegetables, Mushrooms, fruits, Legumes, Whole Grains, Nuts, seeds in lieu of processed meats, processed snacks/pastries red meat, poultry, eggs.    -It is better to avoid simple carbohydrates including: Cakes, Sweet Desserts, Ice Cream, Soda (diet and regular), Sweet Tea, Candies, Chips, Cookies, Store Bought Juices, Alcohol in Excess of  1-2 drinks a day, Lemonade,  Artificial Sweeteners, Doughnuts, Coffee Creamers, Sugar-free Products, etc, etc.  This is not a complete list.....  Exercise: If you are able: 30 -60 minutes a day ,4 days a week, or 150 minutes a week.  The longer the better.  Combine stretch, strength, and aerobic activities.  If you were told in the past that you have high risk for cardiovascular diseases, you may seek evaluation by your heart doctor prior to initiating moderate to intense exercise programs.   Handouts Provided Include  Lifestyle Medicine Log sheets  Learning Style & Readiness for Change Teaching method utilized: Visual & Auditory  Demonstrated degree of understanding via: Teach Back  Barriers to learning/adherence to lifestyle change: none  Goals Established by Pt Eat three meals per day Test blood sugars in am and before bed a few times per wek Get new prescription for test strips since they're out of date. Focus on more whole plant based foods and cut out processed foods and sugar free products. Get A1C below 6.5%  MONITORING & EVALUATION Dietary intake, weekly physical activity, and glucose in 2 months.  Next Steps  Patient is to work on eating more whole plant based foods.SABRA

## 2024-05-14 NOTE — Patient Instructions (Signed)
 Goals Established by Pt Eat three meals per day Test blood sugars in am and before bed a few times per wek Get new prescription for test strips since they're out of date. Focus on more whole plant based foods and cut out processed foods and sugar free products. Get A1C below 6.5%

## 2024-05-22 DIAGNOSIS — M25512 Pain in left shoulder: Secondary | ICD-10-CM | POA: Diagnosis not present

## 2024-05-22 DIAGNOSIS — M19012 Primary osteoarthritis, left shoulder: Secondary | ICD-10-CM | POA: Diagnosis not present

## 2024-06-17 DIAGNOSIS — M19012 Primary osteoarthritis, left shoulder: Secondary | ICD-10-CM | POA: Diagnosis not present

## 2024-06-17 DIAGNOSIS — M25512 Pain in left shoulder: Secondary | ICD-10-CM | POA: Diagnosis not present

## 2024-07-02 ENCOUNTER — Encounter: Attending: Family Medicine | Admitting: Nutrition

## 2024-07-02 VITALS — Ht 72.0 in | Wt 180.0 lb

## 2024-07-02 DIAGNOSIS — E118 Type 2 diabetes mellitus with unspecified complications: Secondary | ICD-10-CM | POA: Diagnosis not present

## 2024-07-02 NOTE — Progress Notes (Signed)
 Medical Nutrition Therapy  Appointment Start time:  0830  Appointment End time: 0900  Primary concerns today: Dm Type 2  Referral diagnosis: E11.8 Preferred learning style: Ready  and see  Learning readiness: Ready    NUTRITION ASSESSMENT  88 yr old wmale referred for Type 2 DM from Dr. Marvine.  Has cut back on some bread, eating more vegetables and fruit. Drinking more water and some milk, occassionally a diet sodas. Checks BS at 8 am  and checks before bed, 930-10 pm. FBS 110-120-130's. Bedtime 150's. Currently on Janument 50/1000 BID. Keeping Dr. Marvine as his PCP. Next visit December for labs.  Willing to work on [employer] with focusing on more of a whole plant based diet to reverse his diabetes. Clinical Medical Hx:  Past Medical History:  Diagnosis Date   Degenerative joint disease involving multiple joints    Diabetes (HCC)    GERD (gastroesophageal reflux disease)    Hyperglyceridemia    IBS (irritable bowel syndrome)    SCCA (squamous cell carcinoma) of skin 04/30/2020   Left forearm posterior    Medications:  Current Outpatient Medications on File Prior to Visit  Medication Sig Dispense Refill   acetaminophen (TYLENOL) 500 MG tablet Take 500-1,000 mg by mouth every 6 (six) hours as needed (for pain.).     alum & mag hydroxide-simeth (MAALOX MAX) 400-400-40 MG/5ML suspension Take 15 mLs by mouth every 6 (six) hours as needed for indigestion. 355 mL 0   aspirin 325 MG tablet Take by mouth.     baclofen (LIORESAL) 10 MG tablet Take 10 mg by mouth 2 (two) times daily as needed (muscle tightness).     Carboxymeth-Glycerin-Polysorb (REFRESH OPTIVE ADVANCED) 0.5-1-0.5 % SOLN Place 1 drop into both eyes 3 (three) times daily as needed (dry/irritated eyes.).     famotidine  (PEPCID ) 20 MG tablet Take 1 tablet (20 mg total) by mouth daily. 30 tablet 0   Glucosamine-Chondroitin-MSM-D3 TABS Take 2 tablets by mouth daily.     loperamide (IMODIUM) 2 MG capsule Take  2-4 mg by mouth 4 (four) times daily as needed for diarrhea or loose stools.     Probiotic Product (PROBIOTIC DAILY) CAPS Take 1 capsule by mouth daily.     sitaGLIPtin-metformin (JANUMET) 50-1000 MG tablet Take 1 tablet by mouth daily.     tamsulosin (FLOMAX) 0.4 MG CAPS capsule Take 0.4 mg by mouth every evening.     No current facility-administered medications on file prior to visit.    Labs: A1C 7.3%    Latest Ref Rng & Units 04/06/2024    5:58 PM 01/27/2023    8:42 AM 02/01/2019    8:30 AM  CMP  Glucose 70 - 99 mg/dL 811     BUN 8 - 23 mg/dL 13     Creatinine 9.38 - 1.24 mg/dL 9.07  9.19  9.09   Sodium 135 - 145 mmol/L 138     Potassium 3.5 - 5.1 mmol/L 4.4     Chloride 98 - 111 mmol/L 100     CO2 22 - 32 mmol/L 27     Calcium 8.9 - 10.3 mg/dL 89.8     Total Protein 6.5 - 8.1 g/dL 7.6     Total Bilirubin 0.0 - 1.2 mg/dL 0.8     Alkaline Phos 38 - 126 U/L 69     AST 15 - 41 U/L 19     ALT 0 - 44 U/L 13      Lipid Panel  No  results found for: CHOL, TRIG, HDL, CHOLHDL, VLDL, LDLCALC, LDLDIRECT, LABVLDL  Notable Signs/Symptoms: none  Lifestyle & Dietary Hx Lives with his wife  Estimated daily fluid intake: 40 oz Supplements: Sleep:  Stress / self-care:  Current average weekly physical activity: Walks some; active  24-Hr Dietary Recall Breakfast: bowl of oatmeal  1 cup and rice crispies 1 cup Lunch: garden salad with grilled chicken,   Dinner:  6pm  [place] stew    Estimated Energy Needs Calories: 1800 Carbohydrate: 200g Protein: 135g Fat: 50g   NUTRITION DIAGNOSIS  NB-1.1 Food and nutrition-related knowledge deficit As related to inconsistent diet to meet his needs.  As evidenced by A1C 7.3%.   NUTRITION INTERVENTION  Nutrition education (E-1) on the following topics:  Nutrition and Diabetes education provided on My Plate, CHO counting, meal planning, portion sizes, timing of meals, avoiding snacks between meals unless having a low blood  sugar, target ranges for A1C and blood sugars, signs/symptoms and treatment of hyper/hypoglycemia, monitoring blood sugars, taking medications as prescribed, benefits of exercising 30 minutes per day and prevention of complications of DM.  Lifestyle Medicine  - Whole Food, Plant Predominant Nutrition is highly recommended: Eat Plenty of vegetables, Mushrooms, fruits, Legumes, Whole Grains, Nuts, seeds in lieu of processed meats, processed snacks/pastries red meat, poultry, eggs.    -It is better to avoid simple carbohydrates including: Cakes, Sweet Desserts, Ice Cream, Soda (diet and regular), Sweet Tea, Candies, Chips, Cookies, Store Bought Juices, Alcohol in Excess of  1-2 drinks a day, Lemonade,  Artificial Sweeteners, Doughnuts, Coffee Creamers, Sugar-free Products, etc, etc.  This is not a complete list.....  Exercise: If you are able: 30 -60 minutes a day ,4 days a week, or 150 minutes a week.  The longer the better.  Combine stretch, strength, and aerobic activities.  If you were told in the past that you have high risk for cardiovascular diseases, you may seek evaluation by your heart doctor prior to initiating moderate to intense exercise programs.   Handouts Provided Include  Lifestyle Medicine Log sheets  Learning Style & Readiness for Change Teaching method utilized: Visual & Auditory  Demonstrated degree of understanding via: Teach Back  Barriers to learning/adherence to lifestyle change: none  Goals Established by Pt  Keep up the great job. Aim for A1C 6.5% by next visit. Try to get in some daily exercise.  MONITORING & EVALUATION Dietary intake, weekly physical activity, and glucose in 2 months.  Next Steps  Patient is to work on eating more whole plant based foods.SABRA

## 2024-07-11 ENCOUNTER — Encounter: Payer: Self-pay | Admitting: Nutrition

## 2024-07-11 NOTE — Patient Instructions (Signed)
 Keep up the great job. Aim for A1C 6.5% by next visit. Try to get in some daily exercise.

## 2024-12-03 ENCOUNTER — Encounter: Admitting: Nutrition
